# Patient Record
Sex: Female | Born: 1950 | Race: White | Hispanic: No | Marital: Married | Smoking: Never smoker
Health system: Southern US, Community
[De-identification: ages and names within clinical notes are randomized; demographics above are authoritative.]

## PROBLEM LIST (undated history)

## (undated) DIAGNOSIS — I10 Essential (primary) hypertension: Secondary | ICD-10-CM

## (undated) DIAGNOSIS — J45909 Unspecified asthma, uncomplicated: Secondary | ICD-10-CM

## (undated) DIAGNOSIS — E785 Hyperlipidemia, unspecified: Secondary | ICD-10-CM

## (undated) DIAGNOSIS — M1990 Unspecified osteoarthritis, unspecified site: Secondary | ICD-10-CM

## (undated) DIAGNOSIS — T7840XA Allergy, unspecified, initial encounter: Secondary | ICD-10-CM

## (undated) DIAGNOSIS — G43909 Migraine, unspecified, not intractable, without status migrainosus: Secondary | ICD-10-CM

## (undated) DIAGNOSIS — E039 Hypothyroidism, unspecified: Secondary | ICD-10-CM

## (undated) DIAGNOSIS — K219 Gastro-esophageal reflux disease without esophagitis: Secondary | ICD-10-CM

## (undated) DIAGNOSIS — F32A Depression, unspecified: Secondary | ICD-10-CM

## (undated) DIAGNOSIS — H409 Unspecified glaucoma: Secondary | ICD-10-CM

## (undated) DIAGNOSIS — G459 Transient cerebral ischemic attack, unspecified: Secondary | ICD-10-CM

## (undated) DIAGNOSIS — G4730 Sleep apnea, unspecified: Secondary | ICD-10-CM

## (undated) HISTORY — PX: OOPHORECTOMY: SHX86

## (undated) HISTORY — DX: Transient cerebral ischemic attack, unspecified: G45.9

## (undated) HISTORY — PX: BREAST BIOPSY: SHX20

## (undated) HISTORY — PX: ABDOMINAL HYSTERECTOMY: SHX81

## (undated) HISTORY — DX: Allergy, unspecified, initial encounter: T78.40XA

## (undated) HISTORY — PX: CHOLECYSTECTOMY: SHX55

## (undated) HISTORY — DX: Depression, unspecified: F32.A

## (undated) HISTORY — DX: Hyperlipidemia, unspecified: E78.5

## (undated) HISTORY — DX: Unspecified glaucoma: H40.9

## (undated) HISTORY — DX: Sleep apnea, unspecified: G47.30

## (undated) HISTORY — PX: CATARACT EXTRACTION W/ INTRAOCULAR LENS IMPLANT: SHX1309

---

## 1993-05-25 HISTORY — PX: OOPHORECTOMY: SHX86

## 1993-05-25 HISTORY — PX: APPENDECTOMY: SHX54

## 1993-05-25 HISTORY — PX: ABDOMINAL HYSTERECTOMY: SHX81

## 2003-05-26 HISTORY — PX: CHOLECYSTECTOMY: SHX55

## 2019-06-22 ENCOUNTER — Emergency Department: Payer: Medicare Other

## 2019-06-22 ENCOUNTER — Other Ambulatory Visit: Payer: Self-pay

## 2019-06-22 DIAGNOSIS — I10 Essential (primary) hypertension: Secondary | ICD-10-CM | POA: Insufficient documentation

## 2019-06-22 DIAGNOSIS — R404 Transient alteration of awareness: Secondary | ICD-10-CM | POA: Insufficient documentation

## 2019-06-22 DIAGNOSIS — R42 Dizziness and giddiness: Secondary | ICD-10-CM | POA: Diagnosis not present

## 2019-06-22 DIAGNOSIS — J019 Acute sinusitis, unspecified: Secondary | ICD-10-CM | POA: Insufficient documentation

## 2019-06-22 DIAGNOSIS — G43901 Migraine, unspecified, not intractable, with status migrainosus: Secondary | ICD-10-CM | POA: Diagnosis not present

## 2019-06-22 DIAGNOSIS — R519 Headache, unspecified: Secondary | ICD-10-CM | POA: Diagnosis present

## 2019-06-22 LAB — URINALYSIS, COMPLETE (UACMP) WITH MICROSCOPIC
Bacteria, UA: NONE SEEN
Bilirubin Urine: NEGATIVE
Glucose, UA: NEGATIVE mg/dL
Hgb urine dipstick: NEGATIVE
Ketones, ur: NEGATIVE mg/dL
Leukocytes,Ua: NEGATIVE
Nitrite: NEGATIVE
Protein, ur: NEGATIVE mg/dL
Specific Gravity, Urine: 1.002 — ABNORMAL LOW (ref 1.005–1.030)
Squamous Epithelial / HPF: NONE SEEN (ref 0–5)
pH: 7 (ref 5.0–8.0)

## 2019-06-22 LAB — COMPREHENSIVE METABOLIC PANEL
ALT: 22 U/L (ref 0–44)
AST: 25 U/L (ref 15–41)
Albumin: 4.6 g/dL (ref 3.5–5.0)
Alkaline Phosphatase: 116 U/L (ref 38–126)
Anion gap: 8 (ref 5–15)
BUN: 17 mg/dL (ref 8–23)
CO2: 25 mmol/L (ref 22–32)
Calcium: 9.7 mg/dL (ref 8.9–10.3)
Chloride: 101 mmol/L (ref 98–111)
Creatinine, Ser: 0.78 mg/dL (ref 0.44–1.00)
GFR calc Af Amer: >60 mL/min (ref >60)
GFR calc non Af Amer: >60 mL/min (ref >60)
Glucose, Bld: 98 mg/dL (ref 70–99)
Potassium: 4.1 mmol/L (ref 3.5–5.1)
Sodium: 134 mmol/L — ABNORMAL LOW (ref 135–145)
Total Bilirubin: 0.8 mg/dL (ref 0.3–1.2)
Total Protein: 7.7 g/dL (ref 6.5–8.1)

## 2019-06-22 LAB — CBC
HCT: 44.7 % (ref 36.0–46.0)
Hemoglobin: 14.6 g/dL (ref 12.0–15.0)
MCH: 28.3 pg (ref 26.0–34.0)
MCHC: 32.7 g/dL (ref 30.0–36.0)
MCV: 86.6 fL (ref 80.0–100.0)
Platelets: 262 10*3/uL (ref 150–400)
RBC: 5.16 MIL/uL — ABNORMAL HIGH (ref 3.87–5.11)
RDW: 15.9 % — ABNORMAL HIGH (ref 11.5–15.5)
WBC: 9.6 10*3/uL (ref 4.0–10.5)
nRBC: 0 % (ref 0.0–0.2)

## 2019-06-22 NOTE — ED Triage Notes (Signed)
PT to ED via EMS from home. PT c/o feeling "fuzzy headed". PT a/o. C/O headache with no relief from sumatriptan. States this does not feel like a normal migraine. LKW 1500.

## 2019-06-23 ENCOUNTER — Emergency Department: Payer: Medicare Other

## 2019-06-23 ENCOUNTER — Emergency Department
Admission: EM | Admit: 2019-06-23 | Discharge: 2019-06-23 | Disposition: A | Discharge status: Home / Self Care | Payer: Medicare Other | Attending: Emergency Medicine | Admitting: Emergency Medicine

## 2019-06-23 DIAGNOSIS — R404 Transient alteration of awareness: Secondary | ICD-10-CM

## 2019-06-23 DIAGNOSIS — I10 Essential (primary) hypertension: Secondary | ICD-10-CM

## 2019-06-23 DIAGNOSIS — G43901 Migraine, unspecified, not intractable, with status migrainosus: Secondary | ICD-10-CM

## 2019-06-23 DIAGNOSIS — J019 Acute sinusitis, unspecified: Secondary | ICD-10-CM

## 2019-06-23 HISTORY — DX: Gastro-esophageal reflux disease without esophagitis: K21.9

## 2019-06-23 HISTORY — DX: Unspecified asthma, uncomplicated: J45.909

## 2019-06-23 HISTORY — DX: Unspecified osteoarthritis, unspecified site: M19.90

## 2019-06-23 HISTORY — DX: Essential (primary) hypertension: I10

## 2019-06-23 HISTORY — DX: Migraine, unspecified, not intractable, without status migrainosus: G43.909

## 2019-06-23 HISTORY — DX: Hypothyroidism, unspecified: E03.9

## 2019-06-23 MED ORDER — DEXAMETHASONE SODIUM PHOSPHATE 10 MG/ML IJ SOLN
10.0000 mg | Freq: Once | INTRAMUSCULAR | Status: AC
  Administered 2019-06-23: 10 mg via INTRAVENOUS
  Filled 2019-06-23: qty 1

## 2019-06-23 MED ORDER — KETOROLAC TROMETHAMINE 30 MG/ML IJ SOLN
15.0000 mg | Freq: Once | INTRAMUSCULAR | Status: AC
  Administered 2019-06-23: 15 mg via INTRAVENOUS
  Filled 2019-06-23: qty 1

## 2019-06-23 MED ORDER — DROPERIDOL 2.5 MG/ML IJ SOLN
2.5000 mg | Freq: Once | INTRAMUSCULAR | Status: AC
  Administered 2019-06-23: 2.5 mg via INTRAVENOUS
  Filled 2019-06-23: qty 2

## 2019-06-23 MED ORDER — SODIUM CHLORIDE 0.9 % IV BOLUS
500.0000 mL | Freq: Once | INTRAVENOUS | Status: AC
  Administered 2019-06-23: 500 mL via INTRAVENOUS

## 2019-06-23 NOTE — Discharge Instructions (Signed)
As we discussed, your evaluation today was generally reassuring.  Your lab work was within normal limits and he had a CT scan of your head as well as an MRI of the brain.  Other than what appears to be sinusitis, you had no evidence of acute abnormality such as bleeding in the brain or evidence of a stroke.  Your blood pressure is quite high tonight but is unclear if this is due to the pain and being in the emergency department or if it is representative of baseline uncontrolled hypertension.  We discussed the risks and benefits of starting new medications and both agree that it would be better if you follow-up with your primary care doctor to discuss changes to your existing medication regimen.  However if you develop any new or worsening symptoms that concern you while you are visiting in [place], please return immediately to the emergency department.  Continue to take your regular medication as prescribed.

## 2019-06-23 NOTE — ED Notes (Signed)
Pt states she has a headache that started around 8 pm tonight accompanied by disorientation and confusion.  She states she has migraines sometimes and the pain feels similar to that, but the disorientation is new.  She took Imitrex 50mg  and Tylenol 1000mg  around 830 pm.  The pain has not decreased.

## 2019-06-23 NOTE — ED Provider Notes (Signed)
[facility] Emergency Department Provider Note  ____________________________________________   First MD Initiated Contact with Patient 06/23/19 [phone]     (approximate)  I have reviewed the triage vital signs and the nursing notes.   HISTORY  Chief Complaint Headache and Dizziness    HPI Joyce York is a 69 y.o. female with medical history as listed below who presents for evaluation of severe headache, uncontrolled hypertension, and confusion.  She states that she developed a headache tonight starting around 8 PM (approximately 5 hours after I am evaluating her).  After she thought it is her normal migraine but it is worse and more generalized than usual.  She describes the pain as throbbing and sharp and pressure-like and is all throughout the front and middle part of her head.  It is not shooting into her eyes as it sometimes does and she has no visual changes.  She said that she went about her activities with her family this evening but she became aware that she was acting unusual.  Her daughter kept looking at her funny because she was making odd comments that did not make sense and she said that she felt "disoriented".  When the symptoms persisted  and the pain has become severe, her daughter recommended that she come to the emergency department for evaluation.  She has no numbness nor weakness in any of her extremities.  She has no difficulty formulating words or finding the words she wants to say.  She is having some short-term memory issues such as not being able to remember what games she was playing with her grandchildren just a couple of hours ago prior to coming to the emergency department.  She denies fever/chills, neck pain, neck stiffness, chest pain, shortness of breath, cough, nausea, vomiting, abdominal pain, and dysuria.  Nothing in particular makes the symptoms better or worse including taking Imitrex and Tylenol prior to arrival.  Although she  does have a history of migraines, she has no history of stroke.  She takes verapamil for her hypertension as a single agent because she said that she had issues with an ACE inhibitor and thinks that she did not do well on a beta-blocker previously.        Past Medical History:  Diagnosis Date  . Arthritis   . Asthma   . GERD (gastroesophageal reflux disease)   . Hypertension   . Hypothyroidism   . Migraine     There are no problems to display for this patient.   Past Surgical History:  Procedure Laterality Date  . ABDOMINAL HYSTERECTOMY    . CHOLECYSTECTOMY    . OOPHORECTOMY      Prior to Admission medications   Not on File    Allergies Patient has no known allergies.  No family history on file.  Social History Social History   Tobacco Use  . Smoking status: Never Smoker  . Smokeless tobacco: Never Used  Substance Use Topics  . Alcohol use: Yes    Comment: rarely  . Drug use: Yes    Types: Marijuana    Comment: "edible every once in a while"     Review of Systems Constitutional: No fever/chills Eyes: No visual changes. ENT: No sore throat. Cardiovascular: Denies chest pain. Respiratory: Denies shortness of breath. Gastrointestinal: No abdominal pain.  No nausea, no vomiting.  No diarrhea.  No constipation. Genitourinary: Negative for dysuria. Musculoskeletal: Negative for neck pain.  Negative for back pain. Integumentary: Negative for rash. Neurological:  Confusion/disorientation.  Severe pressure-like headache, no visual changes.  No focal numbness nor weakness.   ____________________________________________   PHYSICAL EXAM:  VITAL SIGNS: ED Triage Vitals  Enc Vitals Group     BP 06/22/19 2122 (!) 200/96     Pulse Rate 06/22/19 2122 86     Resp 06/22/19 2122 18     Temp 06/22/19 2122 98.6 F (37 C)     Temp Source 06/23/19 0029 Oral     SpO2 06/22/19 2122 99 %     Weight 06/22/19 2123 81.6 kg (180 lb)     Height 06/22/19 2123 1.549 m (5'  1")     Head Circumference --      Peak Flow --      Pain Score 06/22/19 2123 7     Pain Loc --      Pain Edu? --      Excl. in GC? --     Constitutional: Alert and oriented.  Eyes: Conjunctivae are normal.  Head: Atraumatic. Nose: No congestion/rhinnorhea. Mouth/Throat: Patient is wearing a mask. Neck: No stridor.  No meningeal signs.   Cardiovascular: Normal rate, regular rhythm. Good peripheral circulation. Grossly normal heart sounds. Respiratory: Normal respiratory effort.  No retractions. Gastrointestinal: Soft and nontender. No distention.  Musculoskeletal: No lower extremity tenderness nor edema. No gross deformities of extremities. Neurologic:  Normal speech and language. No gross focal neurologic deficits are appreciated.  NIH stroke scale is 0. Skin:  Skin is warm, dry and intact. Psychiatric: Mood and affect are normal. Speech and behavior are normal.   ____________________________________________   LABS (all labs ordered are listed, but only abnormal results are displayed)  Labs Reviewed  COMPREHENSIVE METABOLIC PANEL - Abnormal; Notable for the following components:      Result Value   Sodium 134 (*)    All other components within normal limits  CBC - Abnormal; Notable for the following components:   RBC 5.16 (*)    RDW 15.9 (*)    All other components within normal limits  URINALYSIS, COMPLETE (UACMP) WITH MICROSCOPIC - Abnormal; Notable for the following components:   Color, Urine COLORLESS (*)    APPearance CLEAR (*)    Specific Gravity, Urine 1.002 (*)    All other components within normal limits  CBG MONITORING, ED   ____________________________________________  EKG  ED ECG REPORT I, Loleta Rose, the attending physician, personally viewed and interpreted this ECG.  Date: 06/22/2019 EKG Time: 21: 28 Rate: 81 Rhythm: normal sinus rhythm QRS Axis: normal Intervals: normal ST/T Wave abnormalities: Non-specific ST segment / T-wave changes, but  no clear evidence of acute ischemia. Narrative Interpretation: no definitive evidence of acute ischemia; does not meet STEMI criteria.   ____________________________________________  RADIOLOGY I, Loleta Rose, personally viewed and evaluated these images (plain radiographs) as part of my medical decision making, as well as reviewing the written report by the radiologist.  ED MD interpretation: No acute abnormalities on head CT without contrast. MRI demonstrates no acute emergent findings although the patient does have some sinusitis.  Official radiology report(s): CT Head Wo Contrast  Result Date: 06/22/2019 CLINICAL DATA:  Headache EXAM: CT HEAD WITHOUT CONTRAST TECHNIQUE: Contiguous axial images were obtained from the base of the skull through the vertex without intravenous contrast. COMPARISON:  None. FINDINGS: Brain: No evidence of acute infarction, hemorrhage, hydrocephalus, extra-axial collection or mass lesion/mass effect. Vascular: No hyperdense vessel or unexpected calcification. Skull: Normal. Negative for fracture or focal lesion. Sinuses/Orbits: There is bilateral ethmoid sinus  disease. Other: None. IMPRESSION: 1. No acute intracranial process. Electronically Signed   By: Zerita Boers M.D.   On: 06/22/2019 22:06   MR BRAIN WO CONTRAST  Result Date: 06/23/2019 CLINICAL DATA:  Initial evaluation for acute headache.  Delirium. EXAM: MRI HEAD WITHOUT CONTRAST TECHNIQUE: Multiplanar, multiecho pulse sequences of the brain and surrounding structures were obtained without intravenous contrast. COMPARISON:  Prior head CT from 06/22/2019. FINDINGS: Brain: Cerebral volume within normal limits for age. Scattered subcentimeter foci of T2/FLAIR hyperintensities seen involving the periventricular, deep, and subcortical white matter of both cerebral hemispheres, nonspecific, but most like related chronic microvascular ischemic disease, mild in nature. No abnormal foci of restricted diffusion to  suggest acute or subacute ischemia. Gray-white matter differentiation maintained. No encephalomalacia to suggest chronic cortical infarction. No foci of susceptibility artifact to suggest acute or chronic intracranial hemorrhage. No mass lesion, midline shift or mass effect. No hydrocephalus. No extra-axial fluid collection. Pituitary gland suprasellar region normal. Midline structures intact. Vascular: Major intracranial vascular flow voids are maintained. Skull and upper cervical spine: Craniocervical junction normal. Upper cervical spine within normal limits. Bone marrow signal intensity normal. No scalp soft tissue abnormality. Sinuses/Orbits: Globes and orbital soft tissues within normal limits. Scattered mucosal thickening seen throughout the frontal sinuses, ethmoidal air cells, and maxillary sinuses. Superimposed air-fluid level present within the left maxillary sinus, consistent with acute sinusitis. Small retention cyst noted within the right sphenoid sinus. Mastoid air cells are clear. Inner ear structures grossly normal. Other: None. IMPRESSION: 1. No acute intracranial abnormality. 2. Mild cerebral white matter disease, nonspecific, but most like related chronic microvascular ischemic changes. 3. Acute left maxillary and ethmoidal sinusitis, which could be contributory to underlying headache. Electronically Signed   By: Jeannine Boga M.D.   On: 06/23/2019 02:46    ____________________________________________   PROCEDURES   Procedure(s) performed (including Critical Care):  Procedures   ____________________________________________   INITIAL IMPRESSION / MDM / [place] / ED COURSE  As part of my medical decision making, I reviewed the following data within the [facility] notes reviewed and incorporated, Labs reviewed , Old chart reviewed and Notes from prior ED visits   Differential diagnosis includes, but is not limited to, complicated  migraine, uncontrolled/malignant hypertension, acute intracranial bleeding, CVA, PRES syndrome.  The patient is significantly hypertensive and apparently this is abnormal for her.  My concern is that this may be hypertension in response to an acute intracranial event such as a CVA or TIA and I am reluctant to acutely drop her blood pressure with IV agents.  Alternatively she could be having hypertension because of the pain secondary to a complicated and atypical migraine.  She has no signs or symptoms of infection that her CBC and metabolic panel are within normal limits.  She has a reassuring neurological exam with an NIH stroke scale is 0, but she is obviously concerned about some memory issues and the confusion she was exhibiting at home earlier.  I will evaluate with an MR brain without contrast to look for any evidence of CVA and/or PRES syndrome.  Permissive hypertension for now, but I will treat her headache with droperidol 2.5 mg IV, Toradol 15 mg IV, Decadron 10 mg IV, and a 500 mL normal saline IV bolus.  I will reassess after the imaging and after the medical treatment to see if she is feeling better.  She understands and agrees with plan.      Clinical Course as of  Jun 22 348  Fri Jun 23, 2019  8280 MRI is reassuring with no acute findings, some chronic changes.  The patient feels a lot better after the migraine treatment.  We discussed her persistent hypertension and the risks and benefits of treating with additional medication at this time.  I offered to prescribe medication such as metoprolol but explained that I would be a little bit more comfortable if she were to follow-up with her doctor.  She agrees and would rather not try any new medications at this time.  I told her about the sinusitis on the MRI and she said that she does have bad sinuses.  She agrees with no antibiotics at this time and will follow up with her doctor soon as possible.  I encouraged her to continue taking her  antihypertensives and to return immediately to the emergency department with any new or worsening symptoms.  She has exhibited no signs of altered mental status while being here, she is ambulatory without difficulty, and she wants to be discharged.  She understands that she should come back if her symptoms worsen.   [CF]    Clinical Course User Index [CF] Loleta Rose, MD     ____________________________________________  FINAL CLINICAL IMPRESSION(S) / ED DIAGNOSES  Final diagnoses:  Migraine with status migrainosus, not intractable, unspecified migraine type  Transient alteration of awareness  Uncontrolled hypertension  Acute sinusitis, recurrence not specified, unspecified location     MEDICATIONS GIVEN DURING THIS VISIT:  Medications  droperidol (INAPSINE) 2.5 MG/ML injection 2.5 mg (2.5 mg Intravenous Given 06/23/19 0221)  ketorolac (TORADOL) 30 MG/ML injection 15 mg (15 mg Intravenous Given 06/23/19 0226)  dexamethasone (DECADRON) injection 10 mg (10 mg Intravenous Given 06/23/19 0225)  sodium chloride 0.9 % bolus 500 mL (500 mLs Intravenous New Bag/Given 06/23/19 0220)     ED Discharge Orders    None      *Please note:  Joyce York was evaluated in Emergency Department on 06/23/2019 for the symptoms described in the history of present illness. She was evaluated in the context of the global COVID-19 pandemic, which necessitated consideration that the patient might be at risk for infection with the SARS-CoV-2 virus that causes COVID-19. Institutional protocols and algorithms that pertain to the evaluation of patients at risk for COVID-19 are in a state of rapid change based on information released by regulatory bodies including the CDC and federal and state organizations. These policies and algorithms were followed during the patient's care in the ED.  Some ED evaluations and interventions may be delayed as a result of limited staffing during the pandemic.*  Note:  This  document was prepared using Dragon voice recognition software and may include unintentional dictation errors.   Loleta Rose, MD 06/23/19 [phone]

## 2021-01-13 IMAGING — MR MR HEAD W/O CM
12 series · 48 of 48 positions shown · non-contrast
Comparison: Prior head CT from 06/22/2019.

CLINICAL DATA: Initial evaluation for acute headache.  Delirium.

EXAM:
MRI HEAD WITHOUT CONTRAST
TECHNIQUE: Multiplanar, multiecho pulse sequences of the brain and surrounding
structures were obtained without intravenous contrast.

[Series 5: ax dwi_tracew · axial · 3.0mm · 0.60mm/px · z∈[-113,+41]mm · 2 of 48 slices shown]
[im 1/48]
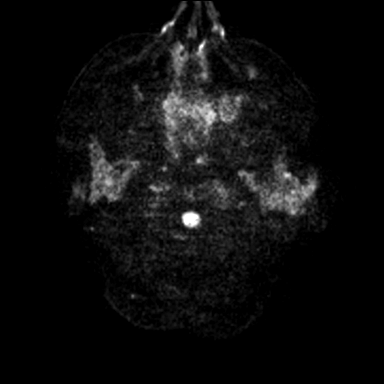
[im 48/48]
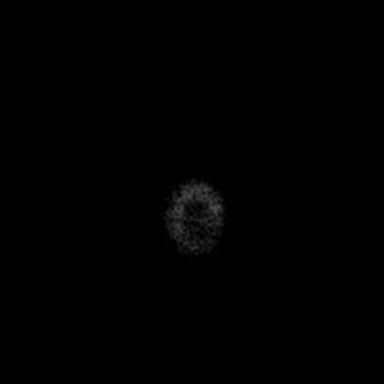

[Series 6: ax dwi_adc · axial · 3.0mm · 0.60mm/px · z∈[-113,+41]mm · 3 of 48 slices shown]
[im 1/48]
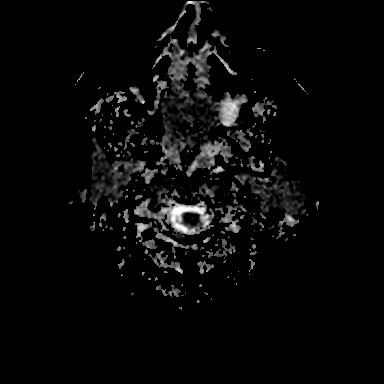
[im 24/48]
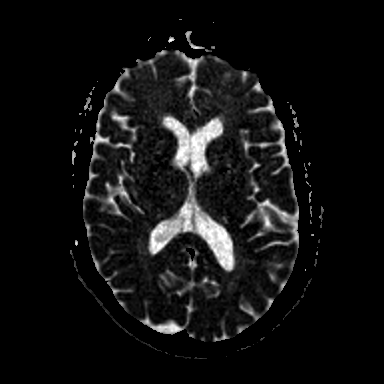
[im 48/48]
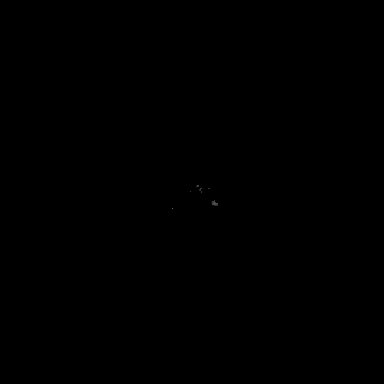

[Series 7: cor dwi_tracew · coronal · 5.0mm · 1.31mm/px · 5 of 76 slices shown]
[im 1/76]
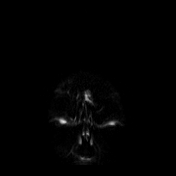
[im 19/76]
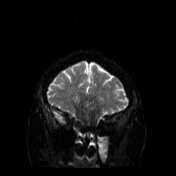
[im 38/76]
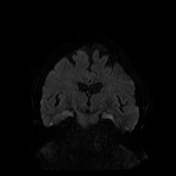
[im 57/76]
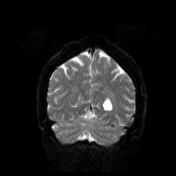
[im 76/76]
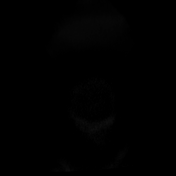

[Series 9: T1 · sagittal · 5.0mm · 0.94mm/px · 2 of 23 slices shown (1 of 2)]
[im 1/23]
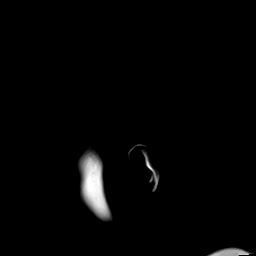
[im 23/23]
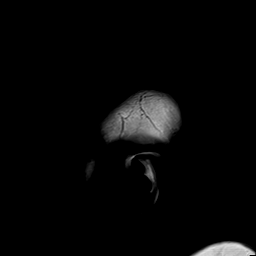

[Series 10: T2 · axial · 5.0mm · 0.53mm/px · z∈[-107,+36]mm · 2 of 25 slices shown (1 of 2)]
[im 1/25]
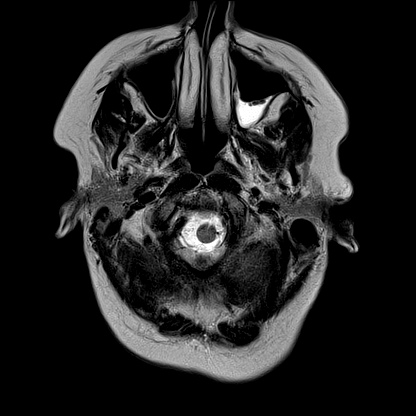
[im 25/25]
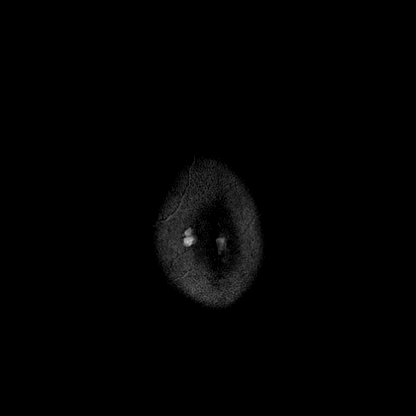

[Series 11: mag_images · axial · 3.0mm · 0.90mm/px · z∈[-124,+52]mm · 4 of 60 slices shown]
[im 1/60]
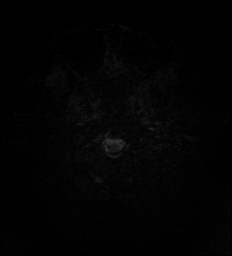
[im 20/60]
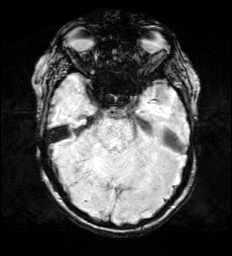
[im 40/60]
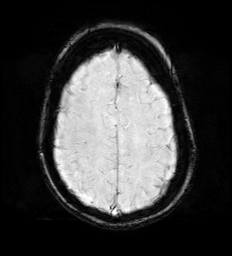
[im 60/60]
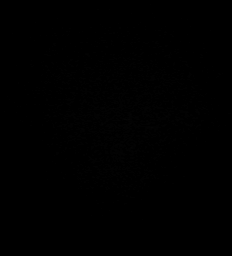

[Series 12: pha_images · axial · 3.0mm · 0.90mm/px · z∈[-124,+46]mm · 4 of 58 slices shown]
[im 1/58]
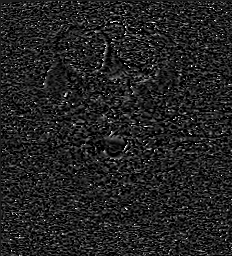
[im 20/58]
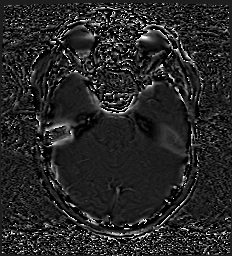
[im 39/58]
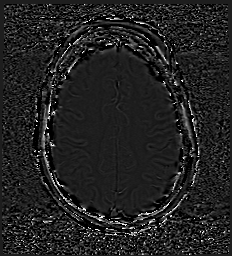
[im 58/58]
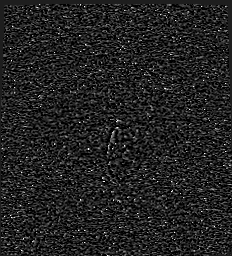

[Series 13: swi_images · axial · 3.0mm · 0.90mm/px · z∈[-124,+52]mm · 4 of 60 slices shown]
[im 1/60]
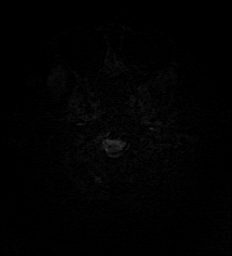
[im 20/60]
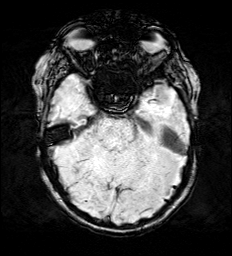
[im 40/60]
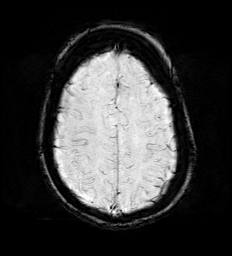
[im 60/60]
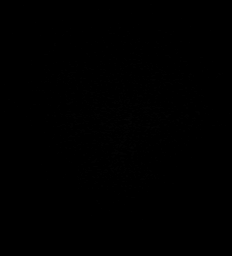

[Series 14: mip_images(sw) · axial · 24.0mm · 0.90mm/px · z∈[-113,+41]mm · 4 of 53 slices shown]
[im 1/53]
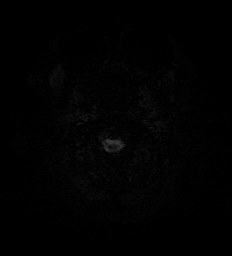
[im 18/53]
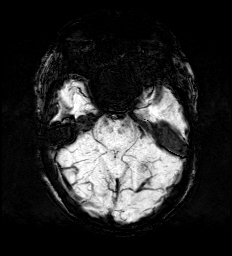
[im 35/53]
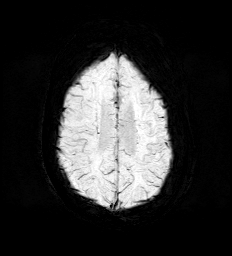
[im 53/53]
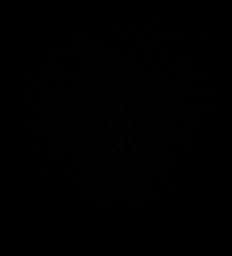

[Series 15: FLAIR · axial · 3.0mm · 0.53mm/px · z∈[-116,+45]mm · 4 of 55 slices shown]
[im 1/55]
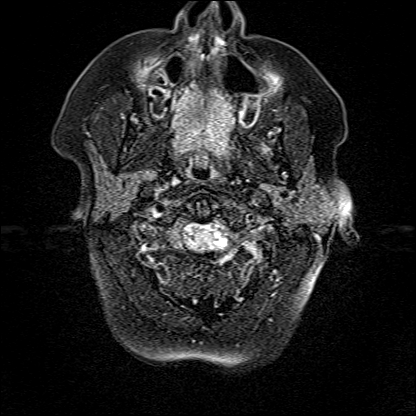
[im 19/55]
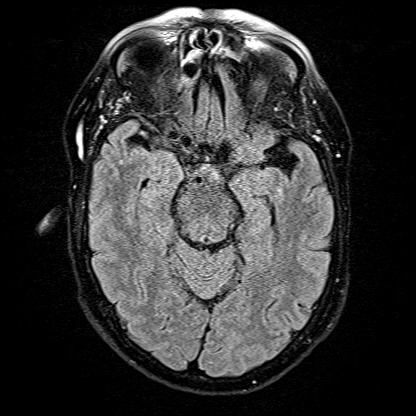
[im 37/55]
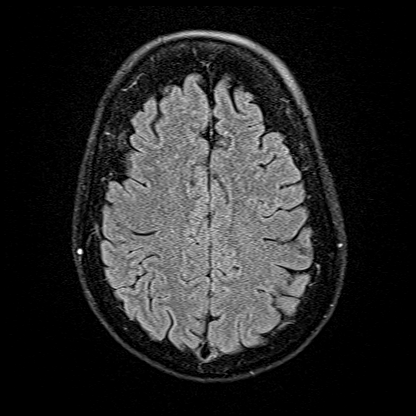
[im 55/55]
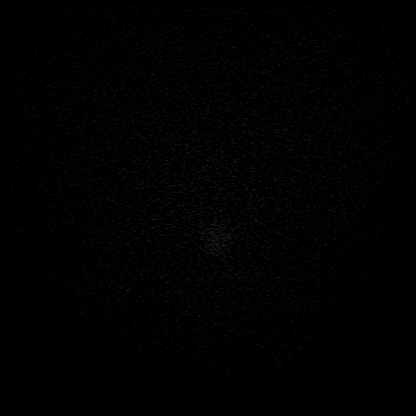

[Series 16: T1 · axial · 1.0mm · 0.98mm/px · z∈[-124,+50]mm · 12 of 176 slices shown (2 of 2)]
[im 1/176]
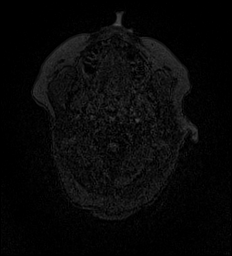
[im 16/176]
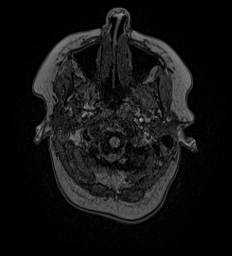
[im 32/176]
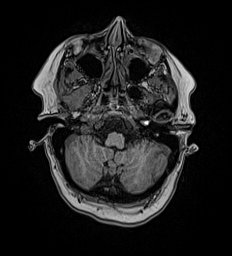
[im 48/176]
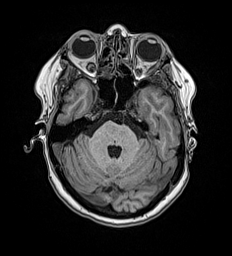
[im 64/176]
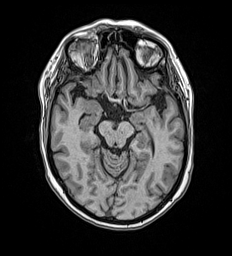
[im 80/176]
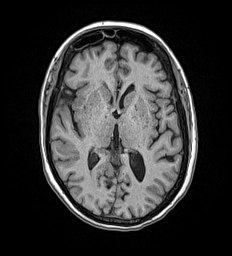
[im 96/176]
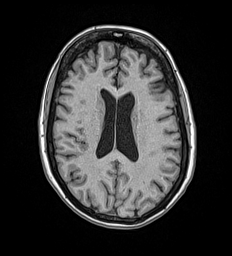
[im 112/176]
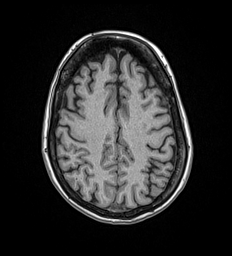
[im 128/176]
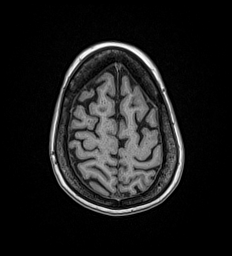
[im 144/176]
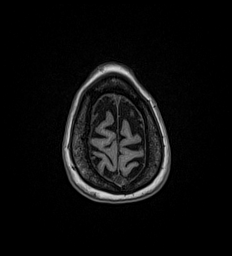
[im 160/176]
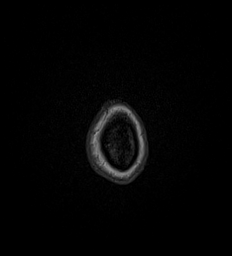
[im 176/176]
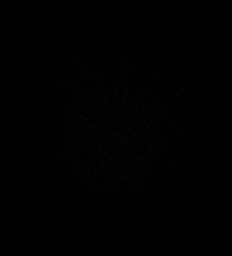

[Series 17: T2 · coronal · 5.0mm · 0.45mm/px · 2 of 31 slices shown (2 of 2)]
[im 1/31]
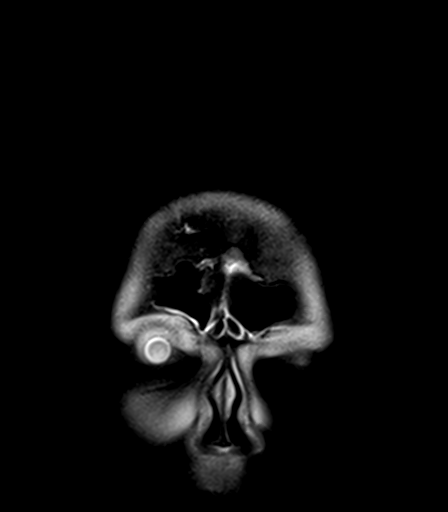
[im 31/31]
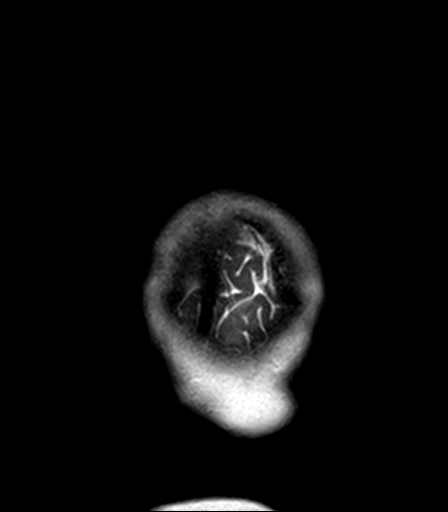

[48 of 48 positions shown; findings below may reference images not displayed]

FINDINGS: Brain: Cerebral volume within normal limits for age. Scattered
subcentimeter foci of T2/FLAIR hyperintensities seen involving the
periventricular, deep, and subcortical white matter of both cerebral
hemispheres, nonspecific, but most like related chronic
microvascular ischemic disease, mild in nature.

No abnormal foci of restricted diffusion to suggest acute or
subacute ischemia. Gray-white matter differentiation maintained. No
encephalomalacia to suggest chronic cortical infarction. No foci of
susceptibility artifact to suggest acute or chronic intracranial
hemorrhage.

No mass lesion, midline shift or mass effect. No hydrocephalus. No
extra-axial fluid collection. Pituitary gland suprasellar region
normal. Midline structures intact.

Vascular: Major intracranial vascular flow voids are maintained.

Skull and upper cervical spine: Craniocervical junction normal.
Upper cervical spine within normal limits. Bone marrow signal
intensity normal. No scalp soft tissue abnormality.

Sinuses/Orbits: Globes and orbital soft tissues within normal
limits. Scattered mucosal thickening seen throughout the frontal
sinuses, ethmoidal air cells, and maxillary sinuses. Superimposed
air-fluid level present within the left maxillary sinus, consistent
with acute sinusitis. Small retention cyst noted within the right
sphenoid sinus. Mastoid air cells are clear. Inner ear structures
grossly normal.

Other: None.
IMPRESSION: 1. No acute intracranial abnormality.
2. Mild cerebral white matter disease, nonspecific, but most like
related chronic microvascular ischemic changes.
3. Acute left maxillary and ethmoidal sinusitis, which could be
contributory to underlying headache.

## 2021-10-23 HISTORY — PX: REVERSE SHOULDER ARTHROPLASTY: SHX5054

## 2023-01-14 LAB — HM HEPATITIS C SCREENING LAB: HM Hepatitis Screen: NEGATIVE

## 2023-06-30 DIAGNOSIS — H40003 Preglaucoma, unspecified, bilateral: Secondary | ICD-10-CM | POA: Diagnosis not present

## 2023-07-25 DIAGNOSIS — E782 Mixed hyperlipidemia: Secondary | ICD-10-CM | POA: Insufficient documentation

## 2023-07-25 DIAGNOSIS — J4520 Mild intermittent asthma, uncomplicated: Secondary | ICD-10-CM | POA: Insufficient documentation

## 2023-07-25 DIAGNOSIS — I10 Essential (primary) hypertension: Secondary | ICD-10-CM | POA: Insufficient documentation

## 2023-07-25 DIAGNOSIS — E038 Other specified hypothyroidism: Secondary | ICD-10-CM | POA: Insufficient documentation

## 2023-07-25 DIAGNOSIS — F325 Major depressive disorder, single episode, in full remission: Secondary | ICD-10-CM | POA: Insufficient documentation

## 2023-07-25 DIAGNOSIS — H3530 Unspecified macular degeneration: Secondary | ICD-10-CM | POA: Insufficient documentation

## 2023-07-25 HISTORY — DX: Unspecified macular degeneration: H35.30

## 2023-07-25 NOTE — Progress Notes (Unsigned)
   Lil Lepage T. Tobias Avitabile, MD, CAQ Sports Medicine [facility] [address] [place], [address]  Phone: [phone]  FAX: [phone]  Joyce York - 73 y.o. female  MRN [id number]  Date of Birth: 1950-09-01  Date: 07/26/2023  PCP: Pcp, No  Referral: No ref. provider found  No chief complaint on file.  Subjective:   Joyce York is a 73 y.o. very pleasant female patient with There is no height or weight on file to calculate BMI. who presents with the following:  Patient is a 73 year old patient who is new to me and new to the entirety of the [facility] health system.    On chart review with what is available and care everywhere, the patient does have a history of hypertension, asthma, hyperlipidemia, hypothyroidism as well as macular degeneration.  She did have a prior full-thickness rotator cuff tear on the left side and ultimately received left-sided reverse shoulder arthroplasty in [place].    Review of Systems is noted in the HPI, as appropriate  Objective:   There were no vitals taken for this visit.  GEN: No acute distress; alert,appropriate. PULM: Breathing comfortably in no respiratory distress PSYCH: Normally interactive.   Laboratory and Imaging Data:  Assessment and Plan:   ***

## 2023-07-26 ENCOUNTER — Encounter: Payer: Self-pay | Admitting: Family Medicine

## 2023-07-26 ENCOUNTER — Other Ambulatory Visit: Payer: Self-pay | Admitting: Family Medicine

## 2023-07-26 ENCOUNTER — Ambulatory Visit (INDEPENDENT_AMBULATORY_CARE_PROVIDER_SITE_OTHER): Payer: Medicare Other | Admitting: Family Medicine

## 2023-07-26 VITALS — BP 132/74 | HR 81 | Temp 97.8°F | Ht 60.0 in | Wt 203.0 lb

## 2023-07-26 DIAGNOSIS — J4520 Mild intermittent asthma, uncomplicated: Secondary | ICD-10-CM | POA: Diagnosis not present

## 2023-07-26 DIAGNOSIS — E038 Other specified hypothyroidism: Secondary | ICD-10-CM | POA: Diagnosis not present

## 2023-07-26 DIAGNOSIS — F325 Major depressive disorder, single episode, in full remission: Secondary | ICD-10-CM | POA: Diagnosis not present

## 2023-07-26 DIAGNOSIS — G2581 Restless legs syndrome: Secondary | ICD-10-CM | POA: Diagnosis not present

## 2023-07-26 DIAGNOSIS — H3530 Unspecified macular degeneration: Secondary | ICD-10-CM

## 2023-07-26 DIAGNOSIS — Z6841 Body Mass Index (BMI) 40.0 and over, adult: Secondary | ICD-10-CM

## 2023-07-26 DIAGNOSIS — F411 Generalized anxiety disorder: Secondary | ICD-10-CM | POA: Insufficient documentation

## 2023-07-26 DIAGNOSIS — G4733 Obstructive sleep apnea (adult) (pediatric): Secondary | ICD-10-CM | POA: Diagnosis not present

## 2023-07-26 DIAGNOSIS — K2270 Barrett's esophagus without dysplasia: Secondary | ICD-10-CM | POA: Insufficient documentation

## 2023-07-26 DIAGNOSIS — F32A Depression, unspecified: Secondary | ICD-10-CM | POA: Insufficient documentation

## 2023-07-26 DIAGNOSIS — G459 Transient cerebral ischemic attack, unspecified: Secondary | ICD-10-CM | POA: Insufficient documentation

## 2023-07-26 DIAGNOSIS — G43909 Migraine, unspecified, not intractable, without status migrainosus: Secondary | ICD-10-CM | POA: Insufficient documentation

## 2023-07-26 DIAGNOSIS — M1990 Unspecified osteoarthritis, unspecified site: Secondary | ICD-10-CM | POA: Insufficient documentation

## 2023-07-26 DIAGNOSIS — H409 Unspecified glaucoma: Secondary | ICD-10-CM | POA: Insufficient documentation

## 2023-07-26 DIAGNOSIS — I10 Essential (primary) hypertension: Secondary | ICD-10-CM

## 2023-07-26 DIAGNOSIS — E782 Mixed hyperlipidemia: Secondary | ICD-10-CM | POA: Diagnosis not present

## 2023-07-26 DIAGNOSIS — K219 Gastro-esophageal reflux disease without esophagitis: Secondary | ICD-10-CM | POA: Insufficient documentation

## 2023-07-26 DIAGNOSIS — Z8673 Personal history of transient ischemic attack (TIA), and cerebral infarction without residual deficits: Secondary | ICD-10-CM

## 2023-07-26 DIAGNOSIS — J45909 Unspecified asthma, uncomplicated: Secondary | ICD-10-CM | POA: Insufficient documentation

## 2023-07-26 HISTORY — DX: Restless legs syndrome: G25.81

## 2023-07-26 HISTORY — DX: Obstructive sleep apnea (adult) (pediatric): G47.33

## 2023-07-26 HISTORY — DX: Barrett's esophagus without dysplasia: K22.70

## 2023-07-26 HISTORY — DX: Generalized anxiety disorder: F41.1

## 2023-07-26 MED ORDER — WEGOVY 0.25 MG/0.5ML ~~LOC~~ SOAJ: 0.2500 mg | SUBCUTANEOUS | 0 refills | Status: DC

## 2023-07-26 MED ORDER — NURTEC 75 MG PO TBDP: 1.0000 | ORAL_TABLET | ORAL | 3 refills | Status: DC

## 2023-07-26 MED ORDER — WEGOVY 0.5 MG/0.5ML ~~LOC~~ SOAJ: 0.5000 mg | SUBCUTANEOUS | 3 refills | Status: DC

## 2023-07-26 MED ORDER — LEVOTHYROXINE SODIUM 100 MCG PO TABS: 100.0000 ug | ORAL_TABLET | Freq: Every day | ORAL | 3 refills | Status: AC

## 2023-07-26 MED ORDER — VERAPAMIL HCL ER 240 MG PO CP24: 240.0000 mg | ORAL_CAPSULE | Freq: Every morning | ORAL | 3 refills | Status: AC

## 2023-08-03 ENCOUNTER — Other Ambulatory Visit (HOSPITAL_COMMUNITY): Payer: Self-pay

## 2023-08-03 ENCOUNTER — Telehealth: Payer: Self-pay

## 2023-08-03 NOTE — Telephone Encounter (Signed)
 Pharmacy Patient Advocate Encounter   Received notification from CoverMyMeds that prior authorization for (NURTEC) 75 MG TBDP  is required/requested.   Insurance verification completed.   The patient is insured through [facility] .   Per test claim: PA required; PA started via CoverMyMeds. KEY [id number] . SENT TO PLAN

## 2023-08-06 ENCOUNTER — Encounter: Payer: Self-pay | Admitting: *Deleted

## 2023-08-06 NOTE — Telephone Encounter (Signed)
 Joyce York notified as instructed by telephone.  She states [employer] did send her the Nurtec, she just had to pay for it.  She will think about what she wants to do and let us know about trying a different prophylaxis medication.  Also while on the phone patient states she got notification she is due for a mammogram.  She states she does a diagnostic mammogram due to having previous breast biopsies in the past.  Patient prefers [place] location.  Will forward to Dr. Patsy Lager to order diagnostic mammogram.

## 2023-08-06 NOTE — Telephone Encounter (Signed)
 Pharmacy Patient Advocate Encounter  Received notification from [facility] that Prior Authorization for Nurtec Tab 75mg  Odt has been DENIED due to (1) The drug will not be used in combination with another calcitonin gene related peptide inhibitor for the acute treatment of migraines.    Denial letter received via Fax or CMM. It has been requested and will be uploaded to the media tab once received.   PA #/Case ID/Reference #:  [id number] Key: [id number]

## 2023-08-06 NOTE — Telephone Encounter (Signed)
 Noted.

## 2023-08-06 NOTE — Telephone Encounter (Signed)
 Can you call the patient and let her know that her Nurtec has been denied by her insurance.  We may have to transition her to some of the older, generic medications for migraine prophylaxis.

## 2023-08-16 ENCOUNTER — Telehealth: Payer: Self-pay

## 2023-08-16 ENCOUNTER — Encounter: Payer: Self-pay | Admitting: *Deleted

## 2023-08-16 NOTE — Telephone Encounter (Signed)
 Copied from CRM [id number]. Topic: Clinical - Request for Lab/Test Order >> Aug 16, 2023 11:50 AM Mackie Pai E wrote: Reason for CRM: Patient called in requesting to have a mammogram ordered. Patient stated that she did discuss this with Dr. Cyndie Chime nurse. Patient prefers a [facility], but also open to [place] if that is what PCP recommends. Callback number for patient is [phone] to confirm.

## 2023-08-16 NOTE — Telephone Encounter (Signed)
 She can make her own mammogram appointment.  Here are the numbers for the various imaging centers.  You do not need a referral to make a mammogram appointment, and you may call to make her own mammogram appointment directly around your schedule.  [facility] [phone] [address] [place], [place] [address]  [facility] ([facility]) [address] [place], [place] [address] Phone: [phone] Toll Free: [phone]  MAMMOGRAPHY IN [place]:  [facility] or [place]) [phone] Located on the campus of [facility])  [facility] Location) [address]  [place], [place] [address]

## 2023-08-17 ENCOUNTER — Other Ambulatory Visit: Payer: Self-pay | Admitting: Family Medicine

## 2023-08-17 DIAGNOSIS — Z1231 Encounter for screening mammogram for malignant neoplasm of breast: Secondary | ICD-10-CM

## 2023-08-17 NOTE — Telephone Encounter (Signed)
 Joyce York notified as instructed by telephone.  She wants to go to [facility]. Phone number provided and patient will call to schedule her mammogram.

## 2023-08-27 ENCOUNTER — Other Ambulatory Visit: Payer: Self-pay | Admitting: Family Medicine

## 2023-08-27 DIAGNOSIS — I10 Essential (primary) hypertension: Secondary | ICD-10-CM

## 2023-08-27 MED ORDER — LOSARTAN POTASSIUM 100 MG PO TABS: 100.0000 mg | ORAL_TABLET | Freq: Every evening | ORAL | 3 refills | Status: DC

## 2023-08-27 NOTE — Telephone Encounter (Signed)
 Copied from CRM [id number]. Topic: Clinical - Medication Refill >> Aug 27, 2023 10:56 AM [place] A wrote: Most Recent Primary Care Visit:  Provider: Hannah Beat  Department: [facility]  Visit Type: NEW PT - OFFICE VISIT  Date: 07/26/2023  Medication: losartan (COZAAR) 100 MG tablet  Has the patient contacted their pharmacy? No (Agent: If no, request that the patient contact the pharmacy for the refill. If patient does not wish to contact the pharmacy document the reason why and proceed with request.) (Agent: If yes, when and what did the pharmacy advise?)  Is this the correct pharmacy for this prescription? Yes If no, delete pharmacy and type the correct one.  This is the patient's preferred pharmacy:  [facility] - [place], [place] - [address] [place] [address] Phone: [phone] Fax: [phone]  [facility] [address] [place] - [address] [place] [address] Phone: [phone] Fax: [phone]   Has the prescription been filled recently? No  Is the patient out of the medication? No  Has the patient been seen for an appointment in the last year OR does the patient have an upcoming appointment? Yes  Can we respond through MyChart? Yes  Agent: Please be advised that Rx refills may take up to 3 business days. We ask that you follow-up with your pharmacy.

## 2023-09-01 ENCOUNTER — Ambulatory Visit
Admission: RE | Admit: 2023-09-01 | Discharge: 2023-09-01 | Disposition: A | Discharge status: Home / Self Care | Source: Ambulatory Visit | Attending: Family Medicine | Admitting: Family Medicine

## 2023-09-01 DIAGNOSIS — Z1231 Encounter for screening mammogram for malignant neoplasm of breast: Secondary | ICD-10-CM | POA: Insufficient documentation

## 2023-09-02 ENCOUNTER — Inpatient Hospital Stay
Admission: RE | Admit: 2023-09-02 | Discharge: 2023-09-02 | Disposition: A | Discharge status: Home / Self Care | Payer: Self-pay | Source: Ambulatory Visit | Attending: Family Medicine | Admitting: Family Medicine

## 2023-09-02 ENCOUNTER — Other Ambulatory Visit: Payer: Self-pay | Admitting: *Deleted

## 2023-09-02 DIAGNOSIS — Z1231 Encounter for screening mammogram for malignant neoplasm of breast: Secondary | ICD-10-CM

## 2023-09-09 ENCOUNTER — Other Ambulatory Visit: Payer: Self-pay | Admitting: Family Medicine

## 2023-09-09 NOTE — Telephone Encounter (Signed)
 Copied from CRM [id number]. Topic: Clinical - Medication Refill >> Sep 09, 2023  9:07 AM Howard Macho wrote: Most Recent Primary Care Visit:  Provider: Scherrie Curt  Department: [facility] Diss  Visit Type: NEW PT - OFFICE VISIT  Date: 07/26/2023  Medication: cyclobenzaprine (FLEXERIL) 10 MG tablet  Has the patient contacted their pharmacy? No (Agent: If no, request that the patient contact the pharmacy for the refill. If patient does not wish to contact the pharmacy document the reason why and proceed with request.) (Agent: If yes, when and what did the pharmacy advise?)  Is this the correct pharmacy for this prescription? Yes If no, delete pharmacy and type the correct one.  This is the patient's preferred pharmacy:  [phone], [place] - [address] [place] [address] Phone: [phone] Fax: [phone]   Has the prescription been filled recently? Yes  Is the patient out of the medication? Yes  Has the patient been seen for an appointment in the last year OR does the patient have an upcoming appointment? Yes  Can we respond through MyChart? Yes  Agent: Please be advised that Rx refills may take up to 3 business days. We ask that you follow-up with your pharmacy.

## 2023-09-13 MED ORDER — CYCLOBENZAPRINE HCL 10 MG PO TABS: 10.0000 mg | ORAL_TABLET | Freq: Three times a day (TID) | ORAL | 2 refills | Status: DC | PRN

## 2023-09-13 NOTE — Telephone Encounter (Signed)
 Last office visit 07/26/2023 for Establish Care.  Last refilled ? Listed as historical medication.  Next Appt: 01/26/24 for 6 month follow up.

## 2023-09-19 NOTE — Progress Notes (Unsigned)
     Nylah Butkus T. Shaelynn Dragos, MD, CAQ Sports Medicine [facility] at [facility] [address] [place], [address]  Phone: [phone]  FAX: [phone]  Joyce York - 73 y.o. female  MRN [id number]  Date of Birth: 11/28/1950  Date: 09/20/2023  PCP: Scherrie Curt, MD  Referral: Scherrie Curt, MD  No chief complaint on file.  Subjective:   Joyce York is a 73 y.o. very pleasant female patient with There is no height or weight on file to calculate BMI. who presents with the following:  The patient presents in follow-up, I did see her as a new patient appointment in March, she presents with some ongoing shoulder pain.  She does have a history of prior left-sided reverse shoulder arthroplasty.    Review of Systems is noted in the HPI, as appropriate  Objective:   There were no vitals taken for this visit.  GEN: No acute distress; alert,appropriate. PULM: Breathing comfortably in no respiratory distress PSYCH: Normally interactive.   Laboratory and Imaging Data:  Assessment and Plan:   ***

## 2023-09-20 ENCOUNTER — Ambulatory Visit (INDEPENDENT_AMBULATORY_CARE_PROVIDER_SITE_OTHER): Admitting: Family Medicine

## 2023-09-20 VITALS — BP 110/66 | HR 81 | Temp 97.8°F | Ht 60.0 in | Wt 194.4 lb

## 2023-09-20 DIAGNOSIS — M19011 Primary osteoarthritis, right shoulder: Secondary | ICD-10-CM

## 2023-09-20 DIAGNOSIS — Z6841 Body Mass Index (BMI) 40.0 and over, adult: Secondary | ICD-10-CM | POA: Diagnosis not present

## 2023-09-20 MED ORDER — SEMAGLUTIDE (1 MG/DOSE) 4 MG/3ML ~~LOC~~ SOPN: 1.0000 mg | PEN_INJECTOR | SUBCUTANEOUS | 3 refills | Status: DC

## 2023-09-20 MED ORDER — TRIAMCINOLONE ACETONIDE 40 MG/ML IJ SUSP
40.0000 mg | Freq: Once | INTRAMUSCULAR | Status: AC
Start: 2023-09-20 — End: 2023-09-20
  Administered 2023-09-20: 40 mg via INTRA_ARTICULAR

## 2023-09-20 MED ORDER — CARVEDILOL 25 MG PO TABS: 25.0000 mg | ORAL_TABLET | Freq: Two times a day (BID) | ORAL | 3 refills | Status: AC

## 2023-09-21 ENCOUNTER — Encounter: Payer: Self-pay | Admitting: Family Medicine

## 2023-09-21 MED ORDER — WEGOVY 1 MG/0.5ML ~~LOC~~ SOAJ: 1.0000 mg | SUBCUTANEOUS | 11 refills | Status: DC

## 2023-09-21 NOTE — Addendum Note (Signed)
 Addended by: Wyn Heater on: 09/21/2023 10:27 AM   Modules accepted: Orders

## 2023-09-23 ENCOUNTER — Other Ambulatory Visit: Payer: Self-pay | Admitting: Family Medicine

## 2023-10-08 ENCOUNTER — Other Ambulatory Visit: Payer: Self-pay | Admitting: Family Medicine

## 2023-10-08 MED ORDER — OMEPRAZOLE 40 MG PO CPDR: 40.0000 mg | DELAYED_RELEASE_CAPSULE | Freq: Two times a day (BID) | ORAL | 1 refills | Status: DC

## 2023-10-08 MED ORDER — ROSUVASTATIN CALCIUM 10 MG PO TABS: 10.0000 mg | ORAL_TABLET | Freq: Every evening | ORAL | 1 refills | Status: DC

## 2023-10-08 MED ORDER — SUMATRIPTAN SUCCINATE 50 MG PO TABS: 50.0000 mg | ORAL_TABLET | ORAL | 2 refills | Status: DC | PRN

## 2023-10-08 MED ORDER — ROPINIROLE HCL 1 MG PO TABS
1.0000 mg | ORAL_TABLET | Freq: Every day | ORAL | 1 refills | Status: DC
Start: 2023-10-08 — End: 2023-10-11

## 2023-10-08 NOTE — Telephone Encounter (Signed)
 Last Fill: Omeprazole: Unk, Historical Provider     Rosuvastatin: Unk, Historical Provider     Ropinorole: Unk, Historical Provider     Sumatriptan: Unk, Historical Provider  Last OV: 09/20/23 Next OV: 01/26/24  Routing to provider for review/authorization.

## 2023-10-08 NOTE — Telephone Encounter (Signed)
 Copied from CRM [id number]. Topic: Clinical - Medication Refill >> Oct 08, 2023  1:20 PM Juleen Oakland F wrote: Medication: omeprazole (PRILOSEC) 40 MG capsule rosuvastatin (CRESTOR) 10 MG tablet  rOPINIRole (REQUIP) 1 MG table SUMAtriptan (IMITREX) 50 MG table  Has the patient contacted their pharmacy? Yes (Agent: If no, request that the patient contact the pharmacy for the refill. If patient does not wish to contact the pharmacy document the reason why and proceed with request.) (Agent: If yes, when and what did the pharmacy advise?)  This is the patient's preferred pharmacy:   [facility] - [place], [place] - [address] [place] [address] Phone: [phone] Fax: [phone]   Is this the correct pharmacy for this prescription? Yes If no, delete pharmacy and type the correct one.   Has the prescription been filled recently? Yes  Is the patient out of the medication? No  Has the patient been seen for an appointment in the last year OR does the patient have an upcoming appointment? Yes  Can we respond through MyChart? No  Agent: Please be advised that Rx refills may take up to 3 business days. We ask that you follow-up with your pharmacy.

## 2023-10-11 ENCOUNTER — Other Ambulatory Visit (HOSPITAL_COMMUNITY): Payer: Self-pay

## 2023-10-11 MED ORDER — ROSUVASTATIN CALCIUM 10 MG PO TABS: 10.0000 mg | ORAL_TABLET | Freq: Every evening | ORAL | 1 refills | Status: DC

## 2023-10-11 MED ORDER — ROPINIROLE HCL 1 MG PO TABS: 1.0000 mg | ORAL_TABLET | Freq: Every day | ORAL | 1 refills | Status: DC

## 2023-10-11 NOTE — Telephone Encounter (Signed)
 Received fax from [facility] stating patient's insurance will cover 100 day supply.  Refills resent to 100 day supply except for Sumatriptan .

## 2023-10-11 NOTE — Addendum Note (Signed)
 Addended by: Wyn Heater on: 10/11/2023 11:22 AM   Modules accepted: Orders

## 2023-11-02 DIAGNOSIS — H35363 Drusen (degenerative) of macula, bilateral: Secondary | ICD-10-CM | POA: Diagnosis not present

## 2023-11-02 DIAGNOSIS — H401134 Primary open-angle glaucoma, bilateral, indeterminate stage: Secondary | ICD-10-CM | POA: Diagnosis not present

## 2023-11-02 DIAGNOSIS — H35033 Hypertensive retinopathy, bilateral: Secondary | ICD-10-CM | POA: Diagnosis not present

## 2023-11-02 DIAGNOSIS — Z961 Presence of intraocular lens: Secondary | ICD-10-CM | POA: Diagnosis not present

## 2023-11-05 ENCOUNTER — Other Ambulatory Visit: Payer: Self-pay | Admitting: Family Medicine

## 2023-11-05 MED ORDER — CELECOXIB 100 MG PO CAPS: 100.0000 mg | ORAL_CAPSULE | Freq: Two times a day (BID) | ORAL | 1 refills | Status: DC

## 2023-11-05 MED ORDER — DULOXETINE HCL 60 MG PO CPEP: 60.0000 mg | ORAL_CAPSULE | Freq: Every evening | ORAL | 1 refills | Status: DC

## 2023-11-05 NOTE — Telephone Encounter (Signed)
 Copied from CRM [id number]. Topic: Clinical - Medication Refill >> Nov 05, 2023 10:28 AM [place] wrote: Medication: celecoxib (CELEBREX) 100 MG capsule ; DULoxetine (CYMBALTA) 60 MG capsule   Has the patient contacted their pharmacy? Yes (Agent: If no, request that the patient contact the pharmacy for the refill. If patient does not wish to contact the pharmacy document the reason why and proceed with request.) (Agent: If yes, when and what did the pharmacy advise?)  This is the patient's preferred pharmacy:  [facility] - [place], [place] - [address] [place]  [address] Phone: [phone] Fax: [phone]  Is this the correct pharmacy for this prescription? Yes If no, delete pharmacy and type the correct one.   Has the prescription been filled recently? Yes  Is the patient out of the medication? Yes  Has the patient been seen for an appointment in the last year OR does the patient have an upcoming appointment? Yes  Can we respond through MyChart? Yes  Agent: Please be advised that Rx refills may take up to 3 business days. We ask that you follow-up with your pharmacy.

## 2023-11-05 NOTE — Telephone Encounter (Signed)
 Last office visit 09/20/23 for Right shoulder Pain.  Last refilled ?Joyce York   Next Appt:  01/26/24 for 6 month follow up.

## 2023-11-08 ENCOUNTER — Other Ambulatory Visit: Payer: Self-pay | Admitting: Family Medicine

## 2023-11-12 ENCOUNTER — Telehealth: Payer: Self-pay

## 2023-11-12 NOTE — Telephone Encounter (Signed)
 Copied from CRM [id number]. Topic: General - Other >> Nov 12, 2023 12:52 PM Allyne Areola wrote: Reason for CRM: [facility] is calling to verify if a fax has been received for patient's CPAP supply, they will be faxing it over again today. Best call back [phone]. Call back to confirm.

## 2023-11-12 NOTE — Telephone Encounter (Signed)
 Called to let them know order received and will be addressed by pcp once back in office next week. Placed in PCP box for review.

## 2023-11-15 ENCOUNTER — Other Ambulatory Visit: Payer: Self-pay | Admitting: Family Medicine

## 2023-11-15 ENCOUNTER — Telehealth: Payer: Self-pay

## 2023-11-15 NOTE — Telephone Encounter (Signed)
 Noted.  Will be on the look out for paperwork from [employer].

## 2023-11-15 NOTE — Telephone Encounter (Signed)
 Last office visit 09/20/23 for shoulder pain.  Last refilled 09/13/2023 for #30 with 2 refills.  Next appt: 01/26/24 for 6 month follow up.

## 2023-11-15 NOTE — Telephone Encounter (Signed)
 Copied from CRM [id number]. Topic: General - Other >> Nov 15, 2023  9:44 AM Revonda D wrote: Reason for CRM: Pt stated that [employer] will now be her new suppliers for her Cpap supplies. Pt stated that [employer] will be reaching out to Dr.Copland to have him sign for the supplies.

## 2023-11-19 ENCOUNTER — Other Ambulatory Visit: Payer: Self-pay | Admitting: Family Medicine

## 2023-11-19 MED ORDER — DULOXETINE HCL 30 MG PO CPEP
30.0000 mg | ORAL_CAPSULE | Freq: Every evening | ORAL | 1 refills | Status: DC
Start: 2023-11-19 — End: 2024-03-31

## 2023-11-19 NOTE — Telephone Encounter (Signed)
 Copied from CRM [id number]. Topic: Clinical - Medication Refill >> Nov 19, 2023  2:43 PM Donna E wrote: Medication: DULoxetine  (CYMBALTA ) 30 MG capsule   Has the patient contacted their pharmacy? No the original prescription was with previous provider   This is the patient's preferred pharmacy:   [facility] - [place], [place] - [address] [place] [address] Phone: [phone] Fax: [phone]   Is this the correct pharmacy for this prescription? Yes If no, delete pharmacy and type the correct one.   Has the prescription been filled recently? No  last refilled 03/17/2023 in [place]    Is the patient out of the medication? No  Has the patient been seen for an appointment in the last year OR does the patient have an upcoming appointment? Yes  Can we respond through MyChart? Yes  Agent: Please be advised that Rx refills may take up to 3 business days. We ask that you follow-up with your pharmacy.

## 2023-11-19 NOTE — Telephone Encounter (Signed)
 Refill sent as requested.

## 2023-12-20 ENCOUNTER — Other Ambulatory Visit: Payer: Self-pay | Admitting: Family Medicine

## 2023-12-30 ENCOUNTER — Other Ambulatory Visit: Payer: Self-pay | Admitting: *Deleted

## 2023-12-30 MED ORDER — QVAR REDIHALER 40 MCG/ACT IN AERB: 1.0000 | INHALATION_SPRAY | Freq: Two times a day (BID) | RESPIRATORY_TRACT | 1 refills | Status: DC

## 2023-12-30 NOTE — Telephone Encounter (Signed)
 Copied from CRM #[id number]. Topic: Clinical - Medication Question >> Dec 30, 2023  3:47 PM [place] M wrote: Reason for CRM: Patient asking to get a refill of beclomethasone (QVAR  REDIHALER) 40 MCG/ACT inhaler, it was from her previous doctor in [place] -sent from [facility]

## 2023-12-30 NOTE — Telephone Encounter (Signed)
 Refill sent as requested.

## 2024-01-07 ENCOUNTER — Other Ambulatory Visit: Payer: Self-pay | Admitting: Family Medicine

## 2024-01-19 MED ORDER — ROPINIROLE HCL 1 MG PO TABS: 1.5000 mg | ORAL_TABLET | Freq: Every day | ORAL | 0 refills | Status: DC

## 2024-01-19 NOTE — Addendum Note (Signed)
 Addended by: WENDELL ARLAND RAMAN on: 01/19/2024 10:51 AM   Modules accepted: Orders

## 2024-01-24 NOTE — Progress Notes (Signed)
 "    Zyheir Daft T. Shanen Norris, MD, CAQ Sports Medicine [facility] at [facility] [address] [place], [address]  Phone: [phone]  FAX: [phone]  Joyce York - 73 y.o. female  MRN [id number]  Date of Birth: 1950/07/13  Date: 01/26/2024  PCP: Watt Mirza, MD  Referral: Watt Mirza, MD  Chief Complaint  Patient presents with   Medical Management of Chronic Issues    6 month follow up   Subjective:   Joyce York is a 73 y.o. very pleasant female patient with Body mass index is 32.81 kg/m. who presents with the following:  Discussed the use of AI scribe software for clinical note transcription with the patient, who gave verbal consent to proceed.  This is a patient who is new to me on March 2025.  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  She has a lot of stress caring for her husband who has dementia, and she is a retired therapist, music.  History of asthma on Qvar   History of TIA on aspirin, BP and cholesterol control  Depression for many years.  She was on Cymbalta  90 mg and at abrupt discontinuation caused psychotic symptoms.  Currently she is doing well.  Intermittent migraines.  HTN: Tolerating all medications without side effects Stable and at goal No CP, no sob. No HA.  BP Readings from Last 3 Encounters:  01/26/24 110/70  09/20/23 110/66  07/26/23 132/74    Basic Metabolic Panel:    Component Value Date/Time   NA 134 (L) 06/22/2019 2130   K 4.1 06/22/2019 2130   CL 101 06/22/2019 2130   CO2 25 06/22/2019 2130   BUN 17 06/22/2019 2130   CREATININE 0.78 06/22/2019 2130   GLUCOSE 98 06/22/2019 2130   CALCIUM  9.7 06/22/2019 2130     History of Present Illness Joyce York is a 73 year old female who presents for follow-up and management of weight loss and ingrown toenail.  She has an ingrown toenail causing pain at the corner of the nail. She manages it by placing cotton underneath  and having pedicure changes the nail but finds it challenging to manage on her own.  She has been on Wegovy  for six months, resulting in a weight loss of 35-37 pounds, with her current weight at 168 pounds. She experiences nausea once a week and occasional constipation. She is considering switching to Mounjaro due to availability but is concerned about cost and insurance coverage.  She has a history of a 2.7 cm left adrenal nodule discovered incidentally during a workup for abdominal pain and vomiting. An MRI was performed last October, and a follow-up was recommended within a year to monitor for changes in size.  She experiences arthritis in her feet, limiting her ability to walk for exercise. She engages in water activities at her daughter's pool once a week to stay active and is exploring other low-impact exercise options due to arthritis-related pain.  She has a history of migraines and low back pain, for which she uses cyclobenzaprine  as a muscle relaxant, finding it helpful in managing these conditions.  She recently had a cold but managed without using a nebulizer. Her breathing is currently stable.    Review of Systems is noted in the HPI, as appropriate  Objective:   BP 110/70   Pulse 75   Temp 97.7 F (36.5 C) (Temporal)   Ht 5' (1.524 m)   Wt 168 lb (76.2 kg)   SpO2 97%  BMI 32.81 kg/m   Weight in (lb) to have BMI = 25: 127.7    GEN: WDWN, NAD, Non-toxic HEENT: Atraumatic, Normocephalic. Neck supple. No masses. CV: RRR, No M/G/R. No JVD. No thrill. No extra heart sounds. PULM: CTA B, no wheezes, crackles, rhonchi. No retractions. No resp. distress. No accessory muscle use. EXTR: No c/c/e NEURO Normal gait.  PSYCH: Normally interactive. Conversant.    Right medial nail with some ingrown characteristics and tenderness to palpation medially  Physical Exam MEASUREMENTS: Weight- 168.  Laboratory and Imaging Data:  Assessment and Plan:     ICD-10-CM   1. Other  specified hypothyroidism  E03.8 TSH    T3, free    T4, free    2. TIA (transient ischemic attack)  G45.9     3. Mixed hyperlipidemia  E78.2 Lipid panel    4. Mild intermittent asthma without complication  J45.20     5. Major depressive disorder, single episode, in remission (HCC)  F32.5     6. Essential hypertension  I10     7. GAD (generalized anxiety disorder)  F41.1     8. Elevated blood sugar  R73.9 Basic metabolic panel with GFR    Hemoglobin A1c    9. Encounter for long-term (current) use of medications  Z79.899 CBC with Differential/Platelet    Hepatic function panel    10. Vitamin D  deficiency  E55.9 VITAMIN D  25 Hydroxy (Vit-D Deficiency, Fractures)    11. Left adrenal mass (HCC)  E27.8 MR Abdomen W Wo Contrast    12. Need for influenza vaccination  Z23 Flu vaccine HIGH DOSE PF(Fluzone Trivalent)     Assessment & Plan Ingrown toenail Ingrown toenail at the corner of the nail without infection. - Advise continuation of retraction techniques and use of dental floss or cotton to lift the nail edge. - Consider podiatry referral if condition does not improve or becomes infected.  Obesity, status post significant weight loss with GLP-1 therapy Lost 35-37 pounds with Wegovy  over six months. Considering switch to Mounjaro due to availability and potential for fewer side effects. - Switch to Mounjaro (Zepbound) 5.0 mg once weekly when next Wegovy  dose is due. - Monitor for side effects and efficacy of Mounjaro.  Chronic low back pain and migraine Managed with cyclobenzaprine , providing relief. - Send cyclobenzaprine  prescription to [facility] pharmacy.  Osteoarthritis of the feet Causes pain, limiting walking activities. Engages in water exercises for low-impact activity. - Encourage continuation of water exercises and consider exploring local pool options for regular activity.  Essential hypertension Blood pressure is well-controlled.  Asthma, mild intermittent Managed  without nebulizer use despite recent cold.  Hypothyroidism - Order blood work to check free T3, T4, and TSH levels.  Left adrenal nodule under surveillance 2.7 cm left adrenal nodule with previous MRI recommending follow-up within one year. - Order MRI of the abdomen with and without contrast to reassess the adrenal nodule. - Coordinate with [facility] facility for imaging.  Mixed hyperlipidemia - Order blood work to check cholesterol levels.   Medication Management during today's office visit: Meds ordered this encounter  Medications   cyclobenzaprine  (FLEXERIL ) 10 MG tablet    Sig: Take 1 tablet (10 mg total) by mouth every 8 (eight) hours as needed (muscle tension).    Dispense:  90 tablet    Refill:  1   Medications Discontinued During This Encounter  Medication Reason   cyclobenzaprine  (FLEXERIL ) 10 MG tablet Reorder    Orders placed today for conditions managed today:  Orders Placed This Encounter  Procedures   MR Abdomen W Wo Contrast   Flu vaccine HIGH DOSE PF(Fluzone Trivalent)   HM HEPATITIS C SCREENING LAB   Basic metabolic panel with GFR   CBC with Differential/Platelet   Hepatic function panel   Hemoglobin A1c   Lipid panel   TSH   T3, free   T4, free   VITAMIN D  25 Hydroxy (Vit-D Deficiency, Fractures)    Disposition: Return in about 6 months (around 07/25/2024) for Complete physical.  Dr[employer] One speech-to-text software was used for transcription in this dictation.  Possible transcriptional errors can occur using Animal nutritionist.   Signed,  Jacques DASEN. Anastashia Westerfeld, MD   Outpatient Encounter Medications as of 01/26/2024  Medication Sig   acetaminophen (TYLENOL) 500 MG tablet Take 1,000 mg by mouth every 8 (eight) hours as needed.   ALBUTEROL IN 1-2 puffs every 4 (four) hours as needed.   Ascorbic Acid (VITAMIN C) 1000 MG tablet Take 1,000 mg by mouth daily. Liposomal   aspirin EC 81 MG tablet Take 81 mg by mouth every other day.  Swallow whole.   beclomethasone (QVAR  REDIHALER) 40 MCG/ACT inhaler Inhale 1-2 puffs into the lungs 2 (two) times daily.   carvedilol  (COREG ) 25 MG tablet Take 1 tablet (25 mg total) by mouth 2 (two) times daily with a meal.   celecoxib  (CELEBREX ) 100 MG capsule Take 1 capsule (100 mg total) by mouth 2 (two) times daily.   Cholecalciferol (VITAMIN D3) 50 MCG (2000 UT) TABS Take 1 tablet by mouth daily.   DULoxetine  (CYMBALTA ) 30 MG capsule Take 1 capsule (30 mg total) by mouth every evening.   DULoxetine  (CYMBALTA ) 60 MG capsule Take 1 capsule (60 mg total) by mouth every evening. Take with 30 mg for 90 mg daily   fluticasone (FLONASE) 50 MCG/ACT nasal spray Place 2 sprays into both nostrils daily.   hydrOXYzine (ATARAX) 25 MG tablet TAKE 1 TO 2 TABS BY MOUTH AS NEEDED FOR INSOMNIA   levothyroxine  (SYNTHROID ) 100 MCG tablet Take 1 tablet (100 mcg total) by mouth daily before breakfast.   losartan  (COZAAR ) 100 MG tablet Take 1 tablet (100 mg total) by mouth every evening.   Misc Natural Products (TURMERIC CURCUMIN) CAPS Take 1 capsule by mouth daily. 2000 mg daily (Liposomal)   Multiple Vitamins-Minerals (ICAPS AREDS 2 PO) Take 1 capsule by mouth daily.   omeprazole  (PRILOSEC) 40 MG capsule TAKE 1 CAPSULE BY MOUTH TWICE  DAILY   ondansetron (ZOFRAN) 4 MG tablet Take 4 mg by mouth every 6 (six) hours as needed for nausea or vomiting.   Polyethylene Glycol 3350 (MIRALAX PO) Take by mouth every other day.   Probiotic Product (PROBIOTIC PO) Take 1 capsule by mouth daily.   rOPINIRole  (REQUIP ) 1 MG tablet Take 1.5 tablets (1.5 mg total) by mouth at bedtime.   rosuvastatin  (CRESTOR ) 10 MG tablet Take 1 tablet (10 mg total) by mouth every evening.   sennosides-docusate sodium (SENOKOT-S) 8.6-50 MG tablet Take 1-2 tablets by mouth daily as needed for constipation.   sucralfate (CARAFATE) 1 g tablet Take 1 g by mouth 2 (two) times daily as needed (Stomach Pain).   SUMAtriptan   (IMITREX ) 50 MG tablet Take 1 tablet (50 mg total) by mouth every 2 (two) hours as needed for migraine. May repeat in 2 hours if headache persists or recurs.   verapamil  (VERELAN ) 240 MG 24 hr capsule Take 1 capsule (240 mg total) by mouth every morning.   WEGOVY  1  MG/0.5ML SOAJ Inject 1 mg into the skin once a week.   [DISCONTINUED] cyclobenzaprine  (FLEXERIL ) 10 MG tablet TAKE 1 TABLET (10 MG TOTAL) BY MOUTH EVERY 8 (EIGHT) HOURS AS NEEDED (MUSCLE TENSION).   cyclobenzaprine  (FLEXERIL ) 10 MG tablet Take 1 tablet (10 mg total) by mouth every 8 (eight) hours as needed (muscle tension).   No facility-administered encounter medications on file as of 01/26/2024.   "

## 2024-01-26 ENCOUNTER — Ambulatory Visit (INDEPENDENT_AMBULATORY_CARE_PROVIDER_SITE_OTHER): Admitting: Family Medicine

## 2024-01-26 ENCOUNTER — Encounter: Payer: Self-pay | Admitting: Family Medicine

## 2024-01-26 VITALS — BP 110/70 | HR 75 | Temp 97.7°F | Ht 60.0 in | Wt 168.0 lb

## 2024-01-26 DIAGNOSIS — R739 Hyperglycemia, unspecified: Secondary | ICD-10-CM

## 2024-01-26 DIAGNOSIS — G459 Transient cerebral ischemic attack, unspecified: Secondary | ICD-10-CM

## 2024-01-26 DIAGNOSIS — E278 Other specified disorders of adrenal gland: Secondary | ICD-10-CM | POA: Diagnosis not present

## 2024-01-26 DIAGNOSIS — J4520 Mild intermittent asthma, uncomplicated: Secondary | ICD-10-CM

## 2024-01-26 DIAGNOSIS — E782 Mixed hyperlipidemia: Secondary | ICD-10-CM

## 2024-01-26 DIAGNOSIS — Z79899 Other long term (current) drug therapy: Secondary | ICD-10-CM

## 2024-01-26 DIAGNOSIS — Z23 Encounter for immunization: Secondary | ICD-10-CM | POA: Diagnosis not present

## 2024-01-26 DIAGNOSIS — E038 Other specified hypothyroidism: Secondary | ICD-10-CM | POA: Diagnosis not present

## 2024-01-26 DIAGNOSIS — F325 Major depressive disorder, single episode, in full remission: Secondary | ICD-10-CM

## 2024-01-26 DIAGNOSIS — E559 Vitamin D deficiency, unspecified: Secondary | ICD-10-CM | POA: Diagnosis not present

## 2024-01-26 DIAGNOSIS — I10 Essential (primary) hypertension: Secondary | ICD-10-CM

## 2024-01-26 DIAGNOSIS — F411 Generalized anxiety disorder: Secondary | ICD-10-CM

## 2024-01-26 LAB — CBC WITH DIFFERENTIAL/PLATELET
Basophils Absolute: 0.1 K/uL (ref 0.0–0.1)
Basophils Relative: 1.2 % (ref 0.0–3.0)
Eosinophils Absolute: 0 K/uL (ref 0.0–0.7)
Eosinophils Relative: 0.7 % (ref 0.0–5.0)
HCT: 40.4 % (ref 36.0–46.0)
Hemoglobin: 13.3 g/dL (ref 12.0–15.0)
Lymphocytes Relative: 12 % (ref 12.0–46.0)
Lymphs Abs: 0.7 K/uL (ref 0.7–4.0)
MCHC: 32.9 g/dL (ref 30.0–36.0)
MCV: 93.2 fl (ref 78.0–100.0)
Monocytes Absolute: 0.9 K/uL (ref 0.1–1.0)
Monocytes Relative: 14 % — ABNORMAL HIGH (ref 3.0–12.0)
Neutro Abs: 4.4 K/uL (ref 1.4–7.7)
Neutrophils Relative %: 72.1 % (ref 43.0–77.0)
Platelets: 320 K/uL (ref 150.0–400.0)
RBC: 4.33 Mil/uL (ref 3.87–5.11)
RDW: 14.2 % (ref 11.5–15.5)
WBC: 6.1 K/uL (ref 4.0–10.5)

## 2024-01-26 LAB — BASIC METABOLIC PANEL WITH GFR
BUN: 14 mg/dL (ref 6–23)
CO2: 28 meq/L (ref 19–32)
Calcium: 9.2 mg/dL (ref 8.4–10.5)
Chloride: 97 meq/L (ref 96–112)
Creatinine, Ser: 0.78 mg/dL (ref 0.40–1.20)
GFR: 76.1 mL/min (ref >60.00)
Glucose, Bld: 76 mg/dL (ref 70–99)
Potassium: 4.9 meq/L (ref 3.5–5.1)
Sodium: 132 meq/L — ABNORMAL LOW (ref 135–145)

## 2024-01-26 LAB — HEPATIC FUNCTION PANEL
ALT: 20 U/L (ref 0–35)
AST: 21 U/L (ref 0–37)
Albumin: 3.9 g/dL (ref 3.5–5.2)
Alkaline Phosphatase: 106 U/L (ref 39–117)
Bilirubin, Direct: 0.2 mg/dL (ref 0.0–0.3)
Total Bilirubin: 0.7 mg/dL (ref 0.2–1.2)
Total Protein: 6.3 g/dL (ref 6.0–8.3)

## 2024-01-26 LAB — LIPID PANEL
Cholesterol: 147 mg/dL (ref 0–200)
HDL: 63.3 mg/dL (ref >39.00)
LDL Cholesterol: 72 mg/dL (ref 0–99)
NonHDL: 83.7
Total CHOL/HDL Ratio: 2
Triglycerides: 61 mg/dL (ref 0.0–149.0)
VLDL: 12.2 mg/dL (ref 0.0–40.0)

## 2024-01-26 LAB — T4, FREE: Free T4: 1.08 ng/dL (ref 0.60–1.60)

## 2024-01-26 LAB — T3, FREE: T3, Free: 3 pg/mL (ref 2.3–4.2)

## 2024-01-26 LAB — HEMOGLOBIN A1C: Hgb A1c MFr Bld: 5.6 % (ref 4.6–6.5)

## 2024-01-26 LAB — TSH: TSH: 0.58 u[IU]/mL (ref 0.35–5.50)

## 2024-01-26 LAB — VITAMIN D 25 HYDROXY (VIT D DEFICIENCY, FRACTURES): VITD: 43.47 ng/mL (ref 30.00–100.00)

## 2024-01-26 MED ORDER — CYCLOBENZAPRINE HCL 10 MG PO TABS: 10.0000 mg | ORAL_TABLET | Freq: Three times a day (TID) | ORAL | 1 refills | Status: DC | PRN

## 2024-01-27 ENCOUNTER — Ambulatory Visit: Payer: Self-pay | Admitting: Family Medicine

## 2024-01-27 ENCOUNTER — Other Ambulatory Visit: Payer: Self-pay | Admitting: Family Medicine

## 2024-01-27 DIAGNOSIS — I10 Essential (primary) hypertension: Secondary | ICD-10-CM

## 2024-01-27 MED ORDER — LOSARTAN POTASSIUM 100 MG PO TABS: 100.0000 mg | ORAL_TABLET | Freq: Every evening | ORAL | 2 refills | Status: AC

## 2024-01-27 NOTE — Telephone Encounter (Signed)
 Copied from CRM #[id number]. Topic: Clinical - Medication Refill >> Jan 27, 2024 12:16 PM Suzen RAMAN wrote: Medication: losartan  (COZAAR ) 100 MG tablet 100 days supply  Has the patient contacted their pharmacy? Yes  This is the patient's preferred pharmacy:  [facility] - [place], [place] - [address] [place] [address] Phone: [phone] Fax: [phone]  Is this the correct pharmacy for this prescription? Yes If no, delete pharmacy and type the correct one.   Has the prescription been filled recently? No  Is the patient out of the medication? No  Has the patient been seen for an appointment in the last year OR does the patient have an upcoming appointment? Yes  Can we respond through MyChart? Yes  Agent: Please be advised that Rx refills may take up to 3 business days. We ask that you follow-up with your pharmacy.

## 2024-02-08 NOTE — Progress Notes (Unsigned)
 Joyce Oscar T. Cordero Surette, MD, CAQ Sports Medicine [facility] at [facility] [address] [place], [address]  Phone: [phone]  FAX: [phone]  Joyce York - 73 y.o. female  MRN [id number]  Date of Birth: 10/20/1950  Date: 02/10/2024  PCP: Watt Mirza, MD  Referral: Watt Mirza, MD  No chief complaint on file.  Subjective:   Joyce York is a 73 y.o. very pleasant female patient with There is no height or weight on file to calculate BMI. who presents with the following:  Discussed the use of AI scribe software for clinical note transcription with the patient, who gave verbal consent to proceed.  She is a very nice patient who I recall well who presents with some ongoing knee pain and arthritis. History of Present Illness     Review of Systems is noted in the HPI, as appropriate  Objective:   There were no vitals taken for this visit.  GEN: No acute distress; alert,appropriate. PULM: Breathing comfortably in no respiratory distress PSYCH: Normally interactive.   Physical Exam   Laboratory and Imaging Data:  Assessment and Plan:   No diagnosis found. Assessment & Plan   Medication Management during today's office visit: No orders of the defined types were placed in this encounter.  There are no discontinued medications.  Orders placed today for conditions managed today: No orders of the defined types were placed in this encounter.   Disposition: No follow-ups on file.  Dragon Medical One speech-to-text software was used for transcription in this dictation.  Possible transcriptional errors can occur using Animal nutritionist.   Signed,  Mirza DASEN. Valery Amedee, MD   Outpatient Encounter Medications as of 02/10/2024  Medication Sig   acetaminophen (TYLENOL) 500 MG tablet Take 1,000 mg by mouth every 8 (eight) hours as needed.   ALBUTEROL IN 1-2 puffs every 4 (four) hours as needed.   Ascorbic Acid (VITAMIN  C) 1000 MG tablet Take 1,000 mg by mouth daily. Liposomal   aspirin EC 81 MG tablet Take 81 mg by mouth every other day. Swallow whole.   beclomethasone (QVAR  REDIHALER) 40 MCG/ACT inhaler Inhale 1-2 puffs into the lungs 2 (two) times daily.   carvedilol  (COREG ) 25 MG tablet Take 1 tablet (25 mg total) by mouth 2 (two) times daily with a meal.   celecoxib  (CELEBREX ) 100 MG capsule Take 1 capsule (100 mg total) by mouth 2 (two) times daily.   Cholecalciferol (VITAMIN D3) 50 MCG (2000 UT) TABS Take 1 tablet by mouth daily.   cyclobenzaprine  (FLEXERIL ) 10 MG tablet Take 1 tablet (10 mg total) by mouth every 8 (eight) hours as needed (muscle tension).   DULoxetine  (CYMBALTA ) 30 MG capsule Take 1 capsule (30 mg total) by mouth every evening.   DULoxetine  (CYMBALTA ) 60 MG capsule Take 1 capsule (60 mg total) by mouth every evening. Take with 30 mg for 90 mg daily   fluticasone (FLONASE) 50 MCG/ACT nasal spray Place 2 sprays into both nostrils daily.   hydrOXYzine (ATARAX) 25 MG tablet TAKE 1 TO 2 TABS BY MOUTH AS NEEDED FOR INSOMNIA   levothyroxine  (SYNTHROID ) 100 MCG tablet Take 1 tablet (100 mcg total) by mouth daily before breakfast.   losartan  (COZAAR ) 100 MG tablet Take 1 tablet (100 mg total) by mouth every evening.   Misc Natural Products (TURMERIC CURCUMIN) CAPS Take 1 capsule by mouth daily. 2000 mg daily (Liposomal)   Multiple Vitamins-Minerals (ICAPS AREDS 2 PO) Take 1 capsule by mouth  daily.   omeprazole  (PRILOSEC) 40 MG capsule TAKE 1 CAPSULE BY MOUTH TWICE  DAILY   ondansetron (ZOFRAN) 4 MG tablet Take 4 mg by mouth every 6 (six) hours as needed for nausea or vomiting.   Polyethylene Glycol 3350 (MIRALAX PO) Take by mouth every other day.   Probiotic Product (PROBIOTIC PO) Take 1 capsule by mouth daily.   rOPINIRole  (REQUIP ) 1 MG tablet Take 1.5 tablets (1.5 mg total) by mouth at bedtime.   rosuvastatin  (CRESTOR ) 10 MG tablet Take 1 tablet (10 mg total) by mouth every evening.    sennosides-docusate sodium (SENOKOT-S) 8.6-50 MG tablet Take 1-2 tablets by mouth daily as needed for constipation.   sucralfate (CARAFATE) 1 g tablet Take 1 g by mouth 2 (two) times daily as needed (Stomach Pain).   SUMAtriptan  (IMITREX ) 50 MG tablet Take 1 tablet (50 mg total) by mouth every 2 (two) hours as needed for migraine. May repeat in 2 hours if headache persists or recurs.   verapamil  (VERELAN ) 240 MG 24 hr capsule Take 1 capsule (240 mg total) by mouth every morning.   WEGOVY  1 MG/0.5ML SOAJ Inject 1 mg into the skin once a week.   No facility-administered encounter medications on file as of 02/10/2024.

## 2024-02-10 ENCOUNTER — Encounter: Payer: Self-pay | Admitting: Family Medicine

## 2024-02-10 ENCOUNTER — Ambulatory Visit: Admitting: Family Medicine

## 2024-02-10 VITALS — BP 150/80 | HR 85 | Temp 97.7°F | Ht 60.0 in | Wt 167.4 lb

## 2024-02-10 DIAGNOSIS — M1712 Unilateral primary osteoarthritis, left knee: Secondary | ICD-10-CM | POA: Diagnosis not present

## 2024-02-10 MED ORDER — TRIAMCINOLONE ACETONIDE 40 MG/ML IJ SUSP
40.0000 mg | Freq: Once | INTRAMUSCULAR | Status: AC
  Administered 2024-02-10: 40 mg via INTRA_ARTICULAR

## 2024-02-20 NOTE — Progress Notes (Addendum)
 Dametra Whetsel T. Loreta Blouch, MD, CAQ Sports Medicine [facility] at [facility] [address] [place], [address]  Phone: [phone]  FAX: [phone]  Joyce York - 73 y.o. female  MRN [id number]  Date of Birth: 09-09-1950  Date: 02/21/2024  PCP: Watt Mirza, MD  Referral: Watt Mirza, MD  Chief Complaint  Patient presents with   Knee Pain    Right      ICD-10-CM   1. Primary osteoarthritis of right knee  M17.11      Procedure only Assessment & Plan  Aspiration/Injection Procedure Note Tasfia Vasseur 1950-09-18 Date of procedure: 02/21/2024  Procedure: Large Joint Aspiration / Injection of Knee, R Indications: Pain  Procedure Details Patient verbally consented to procedure. Risks, benefits, and alternatives explained. Sterilely prepped with Chloraprep. Ethyl cholride used for anesthesia. 9 cc Lidocaine 1% mixed with 1 mL of Kenalog  40 mg injected using the anteromedial approach without difficulty. No complications with procedure and tolerated well. Patient had decreased pain post-injection. Medication: 1 mL of Kenalog  40 mg   Medication Management during today's office visit: No orders of the defined types were placed in this encounter.  There are no discontinued medications.  Orders placed today for conditions managed today: No orders of the defined types were placed in this encounter.   Disposition: No follow-ups on file.  Dragon Medical One speech-to-text software was used for transcription in this dictation.  Possible transcriptional errors can occur using Animal nutritionist.   Signed,  Mirza DASEN. Danna Sewell, MD   Outpatient Encounter Medications as of 02/21/2024  Medication Sig   acetaminophen (TYLENOL) 500 MG tablet Take 1,000 mg by mouth every 8 (eight) hours as needed.   ALBUTEROL IN 1-2 puffs every 4 (four) hours as needed.   Ascorbic Acid (VITAMIN C) 1000 MG tablet Take 1,000 mg by mouth daily. Liposomal    aspirin EC 81 MG tablet Take 81 mg by mouth every other day. Swallow whole.   beclomethasone (QVAR  REDIHALER) 40 MCG/ACT inhaler Inhale 1-2 puffs into the lungs 2 (two) times daily.   carvedilol  (COREG ) 25 MG tablet Take 1 tablet (25 mg total) by mouth 2 (two) times daily with a meal.   celecoxib  (CELEBREX ) 100 MG capsule Take 1 capsule (100 mg total) by mouth 2 (two) times daily.   Cholecalciferol (VITAMIN D3) 50 MCG (2000 UT) TABS Take 1 tablet by mouth daily.   cyclobenzaprine  (FLEXERIL ) 10 MG tablet Take 1 tablet (10 mg total) by mouth every 8 (eight) hours as needed (muscle tension).   DULoxetine  (CYMBALTA ) 30 MG capsule Take 1 capsule (30 mg total) by mouth every evening.   DULoxetine  (CYMBALTA ) 60 MG capsule Take 1 capsule (60 mg total) by mouth every evening. Take with 30 mg for 90 mg daily   fluticasone (FLONASE) 50 MCG/ACT nasal spray Place 2 sprays into both nostrils daily.   hydrOXYzine (ATARAX) 25 MG tablet TAKE 1 TO 2 TABS BY MOUTH AS NEEDED FOR INSOMNIA   levothyroxine  (SYNTHROID ) 100 MCG tablet Take 1 tablet (100 mcg total) by mouth daily before breakfast.   losartan  (COZAAR ) 100 MG tablet Take 1 tablet (100 mg total) by mouth every evening.   Misc Natural Products (TURMERIC CURCUMIN) CAPS Take 1 capsule by mouth daily. 2000 mg daily (Liposomal)   Multiple Vitamins-Minerals (ICAPS AREDS 2 PO) Take 1 capsule by mouth daily.   omeprazole  (PRILOSEC) 40 MG capsule TAKE 1 CAPSULE BY MOUTH TWICE  DAILY   ondansetron (ZOFRAN) 4 MG  tablet Take 4 mg by mouth every 6 (six) hours as needed for nausea or vomiting.   Polyethylene Glycol 3350 (MIRALAX PO) Take by mouth every other day.   Probiotic Product (PROBIOTIC PO) Take 1 capsule by mouth daily.   rOPINIRole  (REQUIP ) 1 MG tablet Take 1.5 tablets (1.5 mg total) by mouth at bedtime.   rosuvastatin  (CRESTOR ) 10 MG tablet Take 1 tablet (10 mg total) by mouth every evening.   sennosides-docusate sodium (SENOKOT-S) 8.6-50 MG tablet Take 1-2  tablets by mouth daily as needed for constipation.   sucralfate (CARAFATE) 1 g tablet Take 1 g by mouth 2 (two) times daily as needed (Stomach Pain).   SUMAtriptan  (IMITREX ) 50 MG tablet Take 1 tablet (50 mg total) by mouth every 2 (two) hours as needed for migraine. May repeat in 2 hours if headache persists or recurs.   verapamil  (VERELAN ) 240 MG 24 hr capsule Take 1 capsule (240 mg total) by mouth every morning.   WEGOVY  1 MG/0.5ML SOAJ Inject 1 mg into the skin once a week.   No facility-administered encounter medications on file as of 02/21/2024.

## 2024-02-21 ENCOUNTER — Ambulatory Visit (INDEPENDENT_AMBULATORY_CARE_PROVIDER_SITE_OTHER): Admitting: Family Medicine

## 2024-02-21 ENCOUNTER — Encounter: Payer: Self-pay | Admitting: Family Medicine

## 2024-02-21 VITALS — BP 122/70 | HR 78 | Temp 98.0°F | Ht 60.0 in | Wt 167.0 lb

## 2024-02-21 DIAGNOSIS — M1711 Unilateral primary osteoarthritis, right knee: Secondary | ICD-10-CM

## 2024-02-21 MED ORDER — TRIAMCINOLONE ACETONIDE 40 MG/ML IJ SUSP
40.0000 mg | Freq: Once | INTRAMUSCULAR | Status: AC
  Administered 2024-02-21: 40 mg via INTRA_ARTICULAR

## 2024-02-21 NOTE — Addendum Note (Signed)
 Addended by: WENDELL ARLAND RAMAN on: 02/21/2024 03:32 PM   Modules accepted: Orders

## 2024-02-22 MED ORDER — WEGOVY 1.7 MG/0.75ML ~~LOC~~ SOAJ: 1.7000 mg | SUBCUTANEOUS | 5 refills | Status: AC

## 2024-02-22 NOTE — Addendum Note (Signed)
 Addended by: WATT MIRZA on: 02/22/2024 08:37 AM   Modules accepted: Orders

## 2024-03-02 DIAGNOSIS — H35363 Drusen (degenerative) of macula, bilateral: Secondary | ICD-10-CM | POA: Diagnosis not present

## 2024-03-02 DIAGNOSIS — H401134 Primary open-angle glaucoma, bilateral, indeterminate stage: Secondary | ICD-10-CM | POA: Diagnosis not present

## 2024-03-02 DIAGNOSIS — Z961 Presence of intraocular lens: Secondary | ICD-10-CM | POA: Diagnosis not present

## 2024-03-02 DIAGNOSIS — H35033 Hypertensive retinopathy, bilateral: Secondary | ICD-10-CM | POA: Diagnosis not present

## 2024-03-06 ENCOUNTER — Ambulatory Visit
Admission: RE | Admit: 2024-03-06 | Discharge: 2024-03-06 | Disposition: A | Discharge status: Home / Self Care | Source: Ambulatory Visit | Attending: Family Medicine | Admitting: Family Medicine

## 2024-03-06 DIAGNOSIS — K5730 Diverticulosis of large intestine without perforation or abscess without bleeding: Secondary | ICD-10-CM | POA: Diagnosis not present

## 2024-03-06 DIAGNOSIS — E278 Other specified disorders of adrenal gland: Secondary | ICD-10-CM | POA: Insufficient documentation

## 2024-03-06 DIAGNOSIS — Z9049 Acquired absence of other specified parts of digestive tract: Secondary | ICD-10-CM | POA: Diagnosis not present

## 2024-03-06 MED ORDER — GADOBUTROL 1 MMOL/ML IV SOLN
7.0000 mL | Freq: Once | INTRAVENOUS | Status: AC | PRN
Start: 2024-03-06 — End: 2024-03-06
  Administered 2024-03-06: 7 mL via INTRAVENOUS

## 2024-03-07 ENCOUNTER — Ambulatory Visit

## 2024-03-23 ENCOUNTER — Ambulatory Visit: Payer: Self-pay

## 2024-03-23 NOTE — Telephone Encounter (Signed)
 Please call and scheduled appointment with Dr. Watt next week for shoulder injection.

## 2024-03-23 NOTE — Telephone Encounter (Signed)
  FYI Only or Action Required?: Action required by provider: request for appointment and update on patient condition. Patient would like to schedule shoulder injection with Dr. Watt  Patient was last seen in primary care on 02/21/2024 by Joyce Mirza, MD.  Called Nurse Triage reporting Shoulder Pain.  Symptoms began about a month ago.  Interventions attempted: Rest, hydration, or home remedies.  Symptoms are: gradually worsening.  Triage Disposition: See PCP When Office is Open (Within 3 Days)  Patient/caregiver understands and will follow disposition?: Yes Copied from CRM #[id number]. Topic: Clinical - Red Word Triage >> Mar 23, 2024 12:40 PM Paige D wrote: Red Word that prompted transfer to Nurse Triage:   pain in R shoulder Reason for Disposition  [1] MODERATE pain (e.g., interferes with normal activities) AND [2] present > 3 days  Answer Assessment - Initial Assessment Questions 1. ONSET: When did the pain start?     Pain flared up about one month ago 2. LOCATION: Where is the pain located?     Right shoulder 3. PAIN: How bad is the pain? (Scale 1-10; or mild, moderate, severe)     6/10 4. WORK OR EXERCISE: Has there been any recent work or exercise that involved this part of the body?     denies 5. CAUSE: What do you think is causing the shoulder pain?     arthritis 6. OTHER SYMPTOMS: Do you have any other symptoms? (e.g., neck pain, swelling, rash, fever, numbness, weakness)     denies  Protocols used: Shoulder Pain-A-AH

## 2024-03-24 ENCOUNTER — Ambulatory Visit: Admitting: Clinical

## 2024-03-27 ENCOUNTER — Other Ambulatory Visit: Payer: Self-pay | Admitting: Family Medicine

## 2024-03-27 NOTE — Telephone Encounter (Signed)
 Message from pharmacy:  Please send a replace/new response with 100-Day Supply if appropriate to maximize member benefit. Requesting 1 year supply.   Celebrex  Last filled:  01/25/24, #180 Last OV:  02/21/24, OA of R knee Next OV:  03/29/24, shoulder injection

## 2024-03-28 ENCOUNTER — Other Ambulatory Visit: Payer: Self-pay | Admitting: Family Medicine

## 2024-03-28 NOTE — Progress Notes (Unsigned)
 Kelly Ranieri T. Estreya Clay, MD, CAQ Sports Medicine [facility] at [facility] [address] [place], [address]  Phone: [phone]  FAX: [phone]  Joyce York - 73 y.o. female  MRN [id number]  Date of Birth: 1951/04/10  Date: 03/29/2024  PCP: Watt Mirza, MD  Referral: Watt Mirza, MD  No chief complaint on file.  Subjective:   Joyce York is a 73 y.o. very pleasant female patient with There is no height or weight on file to calculate BMI. who presents with the following:  Discussed the use of AI scribe software for clinical note transcription with the patient, who gave verbal consent to proceed.  This is a very pleasant patient.  I saw her for some glenohumeral arthritis and April 2025, at that point in the right side glenohumeral joint injection. History of Present Illness     Review of Systems is noted in the HPI, as appropriate  Objective:   There were no vitals taken for this visit.  GEN: No acute distress; alert,appropriate. PULM: Breathing comfortably in no respiratory distress PSYCH: Normally interactive.   Laboratory and Imaging Data:  Assessment and Plan:   No diagnosis found. Assessment & Plan   Medication Management during today's office visit: No orders of the defined types were placed in this encounter.  There are no discontinued medications.  Orders placed today for conditions managed today: No orders of the defined types were placed in this encounter.   Disposition: No follow-ups on file.  Dragon Medical One speech-to-text software was used for transcription in this dictation.  Possible transcriptional errors can occur using Animal nutritionist.   Signed,  Mirza DASEN. Meiling Hendriks, MD   Outpatient Encounter Medications as of 03/29/2024  Medication Sig   acetaminophen (TYLENOL) 500 MG tablet Take 1,000 mg by mouth every 8 (eight) hours as needed.   ALBUTEROL IN 1-2 puffs every 4 (four) hours as  needed.   Ascorbic Acid (VITAMIN C) 1000 MG tablet Take 1,000 mg by mouth daily. Liposomal   aspirin EC 81 MG tablet Take 81 mg by mouth every other day. Swallow whole.   beclomethasone (QVAR  REDIHALER) 40 MCG/ACT inhaler Inhale 1-2 puffs into the lungs 2 (two) times daily.   carvedilol  (COREG ) 25 MG tablet Take 1 tablet (25 mg total) by mouth 2 (two) times daily with a meal.   celecoxib  (CELEBREX ) 100 MG capsule TAKE 1 CAPSULE BY MOUTH TWICE  DAILY   Cholecalciferol (VITAMIN D3) 50 MCG (2000 UT) TABS Take 1 tablet by mouth daily.   cyclobenzaprine  (FLEXERIL ) 10 MG tablet Take 1 tablet (10 mg total) by mouth every 8 (eight) hours as needed (muscle tension).   DULoxetine  (CYMBALTA ) 30 MG capsule Take 1 capsule (30 mg total) by mouth every evening.   DULoxetine  (CYMBALTA ) 60 MG capsule Take 1 capsule (60 mg total) by mouth every evening. Take with 30 mg for 90 mg daily   fluticasone (FLONASE) 50 MCG/ACT nasal spray Place 2 sprays into both nostrils daily.   hydrOXYzine (ATARAX) 25 MG tablet TAKE 1 TO 2 TABS BY MOUTH AS NEEDED FOR INSOMNIA   levothyroxine  (SYNTHROID ) 100 MCG tablet Take 1 tablet (100 mcg total) by mouth daily before breakfast.   losartan  (COZAAR ) 100 MG tablet Take 1 tablet (100 mg total) by mouth every evening.   Misc Natural Products (TURMERIC CURCUMIN) CAPS Take 1 capsule by mouth daily. 2000 mg daily (Liposomal)   Multiple Vitamins-Minerals (ICAPS AREDS 2 PO) Take 1 capsule by mouth  daily.   omeprazole  (PRILOSEC) 40 MG capsule TAKE 1 CAPSULE BY MOUTH TWICE  DAILY   ondansetron (ZOFRAN) 4 MG tablet Take 4 mg by mouth every 6 (six) hours as needed for nausea or vomiting.   Polyethylene Glycol 3350 (MIRALAX PO) Take by mouth every other day.   Probiotic Product (PROBIOTIC PO) Take 1 capsule by mouth daily.   rOPINIRole  (REQUIP ) 1 MG tablet Take 1.5 tablets (1.5 mg total) by mouth at bedtime.   rosuvastatin  (CRESTOR ) 10 MG tablet Take 1 tablet (10 mg total) by mouth every evening.    semaglutide -weight management (WEGOVY ) 1.7 MG/0.75ML SOAJ SQ injection Inject 1.7 mg into the skin once a week.   sennosides-docusate sodium (SENOKOT-S) 8.6-50 MG tablet Take 1-2 tablets by mouth daily as needed for constipation.   sucralfate (CARAFATE) 1 g tablet Take 1 g by mouth 2 (two) times daily as needed (Stomach Pain).   SUMAtriptan  (IMITREX ) 50 MG tablet Take 1 tablet (50 mg total) by mouth every 2 (two) hours as needed for migraine. May repeat in 2 hours if headache persists or recurs.   verapamil  (VERELAN ) 240 MG 24 hr capsule Take 1 capsule (240 mg total) by mouth every morning.   No facility-administered encounter medications on file as of 03/29/2024.

## 2024-03-29 ENCOUNTER — Encounter: Payer: Self-pay | Admitting: Family Medicine

## 2024-03-29 ENCOUNTER — Ambulatory Visit: Admitting: Family Medicine

## 2024-03-29 VITALS — BP 136/76 | HR 75 | Temp 97.8°F | Ht 60.0 in | Wt 162.6 lb

## 2024-03-29 DIAGNOSIS — M19011 Primary osteoarthritis, right shoulder: Secondary | ICD-10-CM

## 2024-03-30 ENCOUNTER — Other Ambulatory Visit: Payer: Self-pay | Admitting: Family Medicine

## 2024-04-03 MED ORDER — TRIAMCINOLONE ACETONIDE 40 MG/ML IJ SUSP
40.0000 mg | Freq: Once | INTRAMUSCULAR | Status: AC
  Administered 2024-03-29: 40 mg via INTRA_ARTICULAR

## 2024-04-03 NOTE — Addendum Note (Signed)
 Addended by: SEBASTIAN DANNA GRADE on: 04/03/2024 07:54 AM   Modules accepted: Orders

## 2024-04-04 ENCOUNTER — Ambulatory Visit: Payer: Self-pay

## 2024-04-04 NOTE — Telephone Encounter (Signed)
 I just spoke to the patient, and fortunately her husband died suddenly unexpectedly last evening.  I will of course do the best certificate.  She can take an additional hydroxyzine at nighttime if needed.  We also talked about the Requip , and I would first do the hydroxyzine because she needs to take a little bit extra of the Requip , that would also be okay.

## 2024-04-04 NOTE — Telephone Encounter (Signed)
 FYI Only or Action Required?: Action required by provider: Requesting to speak to PCP.  Patient was last seen in primary care on 03/29/2024 by Watt Mirza, MD.  Called Nurse Triage reporting Advice Only.  Symptoms began n/a.  Interventions attempted: Other: n/a.  Symptoms are: n/a.  Triage Disposition: Call PCP When Office is Open  Patient/caregiver understands and will follow disposition?: Yes Reason for Disposition  [1] Caller requesting NON-URGENT health information AND [2] PCP's office is the best resource  Answer Assessment - Initial Assessment Questions rOPINIRole  (REQUIP ) 1 MG tablet 1.5 at bedtime, patient states this helps restless legs at night but last night it did not and wants to know if she can take more. hydrOXYzine (ATARAX) 25 MG tablet at bedtime for insomnia, states this did not help last night either. Patient has 1pm appointment at [facility]. Patient is wondering if PCP will be signing death certification. Patient requesting a call back, please advise.   1. REASON FOR CALL: What is the main reason for your call? or How can I best help you?     Patient's husband passed away last night and has questions.   2. SYMPTOMS : Do you have any symptoms?      Legs restless last night, insomnia  Protocols used: Information Only Call - No Triage-A-AH

## 2024-04-11 ENCOUNTER — Ambulatory Visit: Admitting: Clinical

## 2024-04-17 ENCOUNTER — Encounter: Payer: Self-pay | Admitting: Family Medicine

## 2024-04-17 MED ORDER — NURTEC 75 MG PO TBDP: 75.0000 mg | ORAL_TABLET | ORAL | 1 refills | Status: DC

## 2024-04-17 MED ORDER — ALPRAZOLAM 0.5 MG PO TABS: 0.5000 mg | ORAL_TABLET | Freq: Two times a day (BID) | ORAL | 0 refills | Status: AC | PRN

## 2024-04-24 ENCOUNTER — Telehealth: Payer: Self-pay

## 2024-04-24 ENCOUNTER — Other Ambulatory Visit (HOSPITAL_COMMUNITY): Payer: Self-pay

## 2024-04-24 NOTE — Telephone Encounter (Signed)
 Pharmacy Patient Advocate Encounter  Received notification from [facility] that Prior Authorization for Nurtec 75 has been APPROVED from 04/24/24 to 05/24/25 for 15 tablets per 30 days. Ran test claim, Copay is $0.00. This test claim was processed through [facility]- copay amounts may vary at other pharmacies due to pharmacy/plan contracts, or as the patient moves through the different stages of their insurance plan.   PA #/Case ID/Reference #: # [id number]

## 2024-04-24 NOTE — Telephone Encounter (Signed)
 Pharmacy Patient Advocate Encounter   Received notification from Onbase that prior authorization for Nurtec 75 is required/requested.   Insurance verification completed.   The patient is insured through [facility].   Per test claim: PA required; PA submitted to above mentioned insurance via Latent Key/confirmation #/EOC [id number] Status is pending

## 2024-05-03 ENCOUNTER — Other Ambulatory Visit: Payer: Self-pay | Admitting: Family Medicine

## 2024-05-05 ENCOUNTER — Telehealth: Payer: Self-pay | Admitting: *Deleted

## 2024-05-05 NOTE — Telephone Encounter (Signed)
 Copied from CRM #[id number]. Topic: Clinical - Medication Question >> May 05, 2024 12:02 PM Winona SAUNDERS wrote: Abby calling from [employer] requesting provider review medications below As they may have side effects for pts withing this age group. cyclobenzaprine  (FLEXERIL ) 10 MG tablet, hydrOXYzine (ATARAX) 25 MG tablet Call back  [phone]

## 2024-05-05 NOTE — Telephone Encounter (Signed)
 Appropriate usage.

## 2024-05-14 ENCOUNTER — Other Ambulatory Visit: Payer: Self-pay | Admitting: Family Medicine

## 2024-05-17 ENCOUNTER — Other Ambulatory Visit: Payer: Self-pay | Admitting: Family Medicine

## 2024-05-23 ENCOUNTER — Ambulatory Visit

## 2024-06-05 ENCOUNTER — Other Ambulatory Visit: Payer: Self-pay | Admitting: Family Medicine

## 2024-06-05 MED ORDER — HYDROXYZINE HCL 25 MG PO TABS: 25.0000 mg | ORAL_TABLET | Freq: Every evening | ORAL | 0 refills | Status: DC | PRN

## 2024-06-05 MED ORDER — NURTEC 75 MG PO TBDP: 75.0000 mg | ORAL_TABLET | ORAL | 0 refills | Status: DC

## 2024-06-05 MED ORDER — CYCLOBENZAPRINE HCL 10 MG PO TABS: 10.0000 mg | ORAL_TABLET | Freq: Three times a day (TID) | ORAL | 1 refills | Status: AC | PRN

## 2024-06-05 MED ORDER — SUMATRIPTAN SUCCINATE 50 MG PO TABS: ORAL_TABLET | ORAL | 0 refills | Status: AC

## 2024-06-05 NOTE — Telephone Encounter (Signed)
 I called and couldn't leave a voicemail since it is full.

## 2024-06-05 NOTE — Telephone Encounter (Signed)
 Last office visit 03/29/2024 for shoulder pain.  Last refilled 01/26/2024 for #90 with 1 refill.  Next appt: CPE 07/26/2024.  Please see if patient will schedule Medicare Wellness with Erminio prior to her CPE with Dr. Watt on 07/26/2024.

## 2024-06-05 NOTE — Telephone Encounter (Signed)
 Pt would like her cyclobenzaprine , hydroxyzine , sumatripton and nurtec medications to be refilled.

## 2024-06-05 NOTE — Telephone Encounter (Signed)
 Her pharmacy has been changed to [facility] [phone]

## 2024-06-07 ENCOUNTER — Encounter: Payer: Self-pay | Admitting: Family Medicine

## 2024-06-07 MED ORDER — PREDNISONE 20 MG PO TABS: ORAL_TABLET | ORAL | 0 refills | Status: AC

## 2024-06-08 ENCOUNTER — Ambulatory Visit (INDEPENDENT_AMBULATORY_CARE_PROVIDER_SITE_OTHER): Admitting: Clinical

## 2024-06-08 DIAGNOSIS — F4321 Adjustment disorder with depressed mood: Secondary | ICD-10-CM

## 2024-06-08 NOTE — Progress Notes (Signed)
 [facility] Counselor Initial Adult Exam  Name: Joyce York Date: 06/08/2024 MRN: [id number] DOB: [birth year] PCP: Watt Mirza, MD  Time spent: 8:35am - 9:18am  Guardian/Payee:  NA    Paperwork requested: NA  Reason for Visit /Presenting Problem: Patient stated, this started about four months ago and I asked Dr. Watt about coming in, at that point my concerns were being a caregiver and since then my husband has died, my trajectory has changed whether I wanted it or not. Patient reported husband died 04-27-25. Patient stated, I'm trying to adjust to being alone.   Mental Status Exam: Appearance:   Neat and Well Groomed     Behavior:  Appropriate  Motor:  Normal  Speech/Language:   Clear and Coherent and Normal Rate  Affect:  Tearful  Mood:  Patient stated, I don't feel good today  Thought process:  normal  Thought content:    WNL  Sensory/Perceptual disturbances:    WNL and Patient reported patient currently has a cold  Orientation:  oriented to person, place, situation, day of week, month of year, and year  Attention:  Good  Concentration:  Good  Memory:  WNL  Fund of knowledge:   Good  Insight:    Good  Judgment:   Good  Impulse Control:  Good   Reported Symptoms: Patient stated, I have my days where I'm teary and sad, I don't have much of one in reference to appetite, difficulty staying asleep, I'm exhausted, decreased focus, anger. Patient reported a history of depression in response to work related stressors (sadness, crying).   Risk Assessment: Danger to Self:  No Patient denied current and past suicidal ideation and homicidal ideation. Patient denied current symptoms of psychosis. Patient reported a history of hallucinations and psychiatric hospitalization when Cymbalta  was discontinued.  Self-injurious Behavior: No Danger to Others: No Duty to Warn:no Physical Aggression / Violence:No  Access to Firearms a concern: No   Gang Involvement:No  Patient / guardian was educated about steps to take if suicide or homicide risk level increases between visits: yes While future psychiatric events cannot be accurately predicted, the patient does not currently require acute inpatient psychiatric care and does not currently meet [place]  involuntary commitment criteria.  Substance Abuse History: Current substance abuse: No   Patient reported no current substance use. Patient reported a history of daily marijuana use, patient stated most of the day, with last use on December 24, 2022. Patient reported no history of tobacco use. Patient reported a history of occasional alcohol use.   Past Psychiatric History:   Previous psychological history is significant for depression Outpatient Providers: Patient reported a history of individual therapy through [facility] (3 sessions) for work related stress and individual therapy in [place]  History of Psych Hospitalization: Yes  once in [place]  Psychological Testing: none   Abuse History:  Victim of: No., none   Report needed: No. Victim of Neglect:No. Perpetrator of none reported  Witness / Exposure to Domestic Violence: No   Protective Services Involvement: No  Witness to [employer] Violence:  No   Family History:  Family History  Problem Relation Age of Onset   Stroke Mother    Hyperlipidemia Mother    Heart disease Mother    Depression Mother    Arthritis Mother    Hypertension Mother    Heart disease Father    Hyperlipidemia Father    Hearing loss Father    Arthritis Father    Hyperlipidemia Sister  Hearing loss Sister    Asthma Sister    Arthritis Sister    Breast cancer Daughter 83   Miscarriages / Stillbirths Daughter    Cancer Daughter    Stroke Maternal Grandmother    Hyperlipidemia Maternal Grandmother    Arthritis Maternal Grandmother    Heart disease Maternal Grandfather    Drug abuse Maternal Grandfather    Heart attack Maternal Grandfather     Cancer Paternal Grandmother    Asthma Paternal Grandmother    Arthritis Paternal Grandmother    Birth defects Paternal Grandfather    Hyperlipidemia Brother    Arthritis Brother     Living situation: the patient lives alone  Sexual Orientation: Straight  Relationship Status: widowed  Name of spouse / other: Mal If a parent, number of children / ages: 1 daughter age 56  Support Systems: daughter and son in Teacher, English As A Foreign Language Stress:  No   Income/Employment/Disability: retired  Financial Planner: No   Educational History: Education: associates degree in nursing  Religion/Sprituality/World View: presbyterian  Any cultural differences that may affect / interfere with treatment:  not applicable   Recreation/Hobbies: sewing, reading  Stressors: Loss of husband, friend's health    Strengths: Supportive Relationships, Family, Church, and Spirituality  Barriers:  none    Legal History: Pending legal issue / charges: The patient has no significant history of legal issues. History of legal issue / charges: none  Medical History/Surgical History: reviewed Past Medical History:  Diagnosis Date   Allergy    Arthritis    Asthma    Barrett's esophagus without dysplasia 07/26/2023   Depression    GAD (generalized anxiety disorder) 07/26/2023   GERD (gastroesophageal reflux disease)    Glaucoma    Hyperlipidemia    Hypertension    Hypothyroidism    Macular degeneration of both eyes 07/25/2023   Migraine    OSA treated with BiPAP 07/26/2023   Restless leg syndrome 07/26/2023   Sleep apnea 2018   TIA (transient ischemic attack)     Past Surgical History:  Procedure Laterality Date   ABDOMINAL HYSTERECTOMY  1995   Fibroids   APPENDECTOMY  1995   BREAST BIOPSY     Biopsies x2   CATARACT EXTRACTION W/ INTRAOCULAR LENS IMPLANT  2024-2025   Cataract with MIG proceure   CHOLECYSTECTOMY  2005   OOPHORECTOMY  1995   REVERSE SHOULDER ARTHROPLASTY Left 10/2021     Medications: Current Outpatient Medications  Medication Sig Dispense Refill   acetaminophen (TYLENOL) 500 MG tablet Take 1,000 mg by mouth every 8 (eight) hours as needed.     ALBUTEROL IN 1-2 puffs every 4 (four) hours as needed.     ALPRAZolam  (XANAX ) 0.5 MG tablet Take 1 tablet (0.5 mg total) by mouth 2 (two) times daily as needed for anxiety or sleep. 20 tablet 0   Ascorbic Acid (VITAMIN C) 1000 MG tablet Take 1,000 mg by mouth daily. Liposomal     aspirin EC 81 MG tablet Take 81 mg by mouth every other day. Swallow whole.     azithromycin (ZITHROMAX) 250 MG tablet Take 250 mg by mouth as directed.     beclomethasone (QVAR  REDIHALER) 40 MCG/ACT inhaler INHALE 1 TO 2 INHALATIONS BY  MOUTH INTO THE LUNGS TWICE DAILY 31.8 g 0   carvedilol  (COREG ) 25 MG tablet Take 1 tablet (25 mg total) by mouth 2 (two) times daily with a meal. 180 tablet 3   celecoxib  (CELEBREX ) 100 MG capsule TAKE 1 CAPSULE BY MOUTH  TWICE  DAILY 180 capsule 3   Cholecalciferol (VITAMIN D3) 50 MCG (2000 UT) TABS Take 1 tablet by mouth daily.     cyclobenzaprine  (FLEXERIL ) 10 MG tablet Take 1 tablet (10 mg total) by mouth every 8 (eight) hours as needed (muscle tension). 90 tablet 1   DULoxetine  (CYMBALTA ) 30 MG capsule TAKE 1 CAPSULE BY MOUTH IN THE  EVENING 90 capsule 3   DULoxetine  (CYMBALTA ) 60 MG capsule TAKE 1 CAPSULE BY MOUTH EVERY  EVENING WITH 30 MG CAPSULE FOR A TOTAL DAILY DOSE OF 90 MG 90 capsule 3   fluticasone (FLONASE) 50 MCG/ACT nasal spray Place 2 sprays into both nostrils daily.     hydrOXYzine  (ATARAX ) 25 MG tablet Take 1-2 tablets (25-50 mg total) by mouth at bedtime as needed. 180 tablet 0   levothyroxine  (SYNTHROID ) 100 MCG tablet Take 1 tablet (100 mcg total) by mouth daily before breakfast. 90 tablet 3   losartan  (COZAAR ) 100 MG tablet Take 1 tablet (100 mg total) by mouth every evening. 100 tablet 2   Misc Natural Products (TURMERIC CURCUMIN) CAPS Take 1 capsule by mouth daily. 2000 mg daily  (Liposomal)     Multiple Vitamins-Minerals (ICAPS AREDS 2 PO) Take 1 capsule by mouth daily.     omeprazole  (PRILOSEC) 40 MG capsule TAKE 1 CAPSULE BY MOUTH TWICE  DAILY 200 capsule 0   ondansetron (ZOFRAN) 4 MG tablet Take 4 mg by mouth every 6 (six) hours as needed for nausea or vomiting.     Polyethylene Glycol 3350 (MIRALAX PO) Take by mouth every other day.     predniSONE  (DELTASONE ) 20 MG tablet 2 tabs po for 4 days, then 1 tab po for 4 days 12 tablet 0   Probiotic Product (PROBIOTIC PO) Take 1 capsule by mouth daily.     Rimegepant Sulfate (NURTEC) 75 MG TBDP Take 1 tablet (75 mg total) by mouth every other day. 45 tablet 0   rOPINIRole  (REQUIP ) 1 MG tablet TAKE 1 AND 1/2 TABLETS BY MOUTH  AT BEDTIME 150 tablet 3   rosuvastatin  (CRESTOR ) 10 MG tablet TAKE 1 TABLET BY MOUTH EVERY  EVENING 100 tablet 3   semaglutide -weight management (WEGOVY ) 1.7 MG/0.75ML SOAJ SQ injection Inject 1.7 mg into the skin once a week. 3 mL 5   sennosides-docusate sodium (SENOKOT-S) 8.6-50 MG tablet Take 1-2 tablets by mouth daily as needed for constipation.     sucralfate (CARAFATE) 1 g tablet Take 1 g by mouth 2 (two) times daily as needed (Stomach Pain).     SUMAtriptan  (IMITREX ) 50 MG tablet TAKE 1 TABLET BY MOUTH EVERY 2  HOURS AS NEEDED FOR MIGRAINE.  MAY REPEAT IN 2 HOURS IF  HEADACHE PERSISTS OR RECUR 10 tablet 0   verapamil  (VERELAN ) 240 MG 24 hr capsule Take 1 capsule (240 mg total) by mouth every morning. 90 capsule 3   No current facility-administered medications for this visit.    Allergies[1]  Diagnoses:  Adjustment disorder with depressed mood  Plan of Care: Patient is a 74 year old female who presented for an initial assessment. Clinician conducted initial assessment in person from clinician's office at [facility]. Patient reported the following symptoms: I have my days where I'm teary and sad, I don't have much of one in reference to appetite, difficulty staying asleep, I'm  exhausted, decreased focus, anger. Patient denied current and past suicidal ideation and homicidal ideation. Patient denied current symptoms of psychosis. Patient reported a history of hallucinations. Patient reported no  current substance use. Patient reported a history of daily marijuana use with last use on December 24, 2022. Patient reported a history of participation in individual therapy. Patient reported one previous psychiatric hospitalization. Patient reported the loss of patient's husband and friend's health are current stressors. Patient reported patient's daughter and son in law are current supports. It is recommended patient participate in individual therapy biweekly. Clinician will review recommendations and treatment plan with patient during follow up appointment. Treatment plan will be developed during follow up appointment.   Collaboration of Care: Primary Care Provider AEB Patient requested to complete a consent for PCP, Dr. Jacques Copland  Patient/Guardian was advised Release of Information must be obtained prior to any record release in order to collaborate their care with an outside provider. Patient/Guardian was advised if they have not already done so to contact [facility] to sign all necessary forms in order for us  to release information regarding their care.   Consent: Patient/Guardian gives written consent for treatment and assignment of benefits for services provided during this visit. Patient/Guardian expressed understanding and agreed to proceed.   Darice Seats, LCSW        [1] No Known Allergies  "

## 2024-06-08 NOTE — Progress Notes (Signed)
   Darice Seats, LCSW

## 2024-06-22 ENCOUNTER — Other Ambulatory Visit (HOSPITAL_COMMUNITY): Payer: Self-pay

## 2024-06-22 ENCOUNTER — Telehealth: Payer: Self-pay

## 2024-06-22 NOTE — Telephone Encounter (Signed)
 Pharmacy Patient Advocate Encounter   Received notification from [facility] KEY that prior authorization for Hydroxyzine  Hcl 25 is required/requested.   Insurance verification completed.   The patient is insured through [facility].   Per test claim: PA required; PA submitted to above mentioned insurance via Latent Key/confirmation #/EOC [id number] Status is pending

## 2024-06-22 NOTE — Telephone Encounter (Signed)
 Pharmacy Patient Advocate Encounter   Received notification from [facility] KEY that prior authorization for Nurtec 75 is required/requested.   Insurance verification completed.   The patient is insured through [facility].   Per test claim: PA required; PA submitted to above mentioned insurance via Latent Key/confirmation #/EOC [id number] Status is pending

## 2024-06-23 ENCOUNTER — Other Ambulatory Visit (HOSPITAL_COMMUNITY): Payer: Self-pay

## 2024-06-23 NOTE — Telephone Encounter (Signed)
 Pharmacy Patient Advocate Encounter  Received notification from [facility] that Prior Authorization for Nurtec 75 has been APPROVED from 06/22/24 to 06/22/25   PA #/Case ID/Reference #: # [id number]

## 2024-06-27 ENCOUNTER — Other Ambulatory Visit (HOSPITAL_COMMUNITY): Payer: Self-pay

## 2024-06-29 ENCOUNTER — Encounter: Payer: Self-pay | Admitting: Family Medicine

## 2024-06-29 ENCOUNTER — Other Ambulatory Visit: Payer: Self-pay | Admitting: Family Medicine

## 2024-06-29 MED ORDER — NURTEC 75 MG PO TBDP: 75.0000 mg | ORAL_TABLET | ORAL | 0 refills | Status: AC

## 2024-06-30 ENCOUNTER — Ambulatory Visit: Admitting: Clinical

## 2024-06-30 ENCOUNTER — Encounter: Payer: Self-pay | Admitting: Clinical

## 2024-06-30 DIAGNOSIS — F4321 Adjustment disorder with depressed mood: Secondary | ICD-10-CM

## 2024-06-30 MED ORDER — HYDROXYZINE HCL 25 MG PO TABS: 25.0000 mg | ORAL_TABLET | Freq: Every evening | ORAL | 0 refills | Status: AC | PRN

## 2024-06-30 NOTE — Progress Notes (Signed)
 "  [facility] Counselor/Therapist Progress Note  Patient ID: Joyce York, MRN: [id number],    Date: 06/30/2024  Time Spent: 2:35pm - 3:28pm : 53 minutes   Treatment Type: Individual Therapy  Reported Symptoms: sadness, tearful  Mental Status Exam: Appearance:  Neat and Well Groomed     Behavior: Appropriate  Motor: Normal  Speech/Language:  Clear and Coherent and Normal Rate  Affect: Tearful  Mood: sad  Thought process: normal  Thought content:   WNL  Sensory/Perceptual disturbances:   WNL  Orientation: oriented to person, place, time/date, and situation  Attention: Good  Concentration: Good  Memory: WNL  Fund of knowledge:  Good  Insight:   Good  Judgment:  Good  Impulse Control: Good   Risk Assessment: Danger to Self:  No Patient denied current suicidal ideation  Self-injurious Behavior: No Danger to Others: No Patient denied current homicidal ideation Duty to Warn:no Physical Aggression / Violence:No  Access to Firearms a concern: No  Gang Involvement:No   Subjective: Patient reported no changes in response to events since last session. Patient stated, some days are good, I think I'm on top of the world, some days are not so good. Patient reported sadness and feelings of frustration at times. Patient stated, good, decent in response to current mood. Patient reported patient joined a grief share group and is excited to participate in group. Patient reported the group starts on the 24th of February. Patient stated,  I think that its good to work on this stuff in response to recommendation for therapy.  Patient reported missing patient's husband. Patient stated, to not feel like I'm so up and down, to level things off a bit in response to goals for therapy.   Interventions: Motivational Interviewing. Clinician conducted session via caregility video from clinician's home office. Patient provided verbal consent to proceed with telehealth session and  is aware of limitations of telephone or video visits. Patient participated in session from patient's home. Clinician reviewed diagnosis and treatment recommendations; and provided psycho education related to diagnosis and treatment as part of treatment planning process. Clinician utilized motivational interviewing to explore potential goals for therapy. Clinician utilized a task centered approach in collaboration with patient to develop treatment plan. Patient participated in development of treatment plan and verbally agreed to treatment plan.  Diagnosis:Adjustment disorder with depressed mood  Collaboration of Care: Other not required at this time  Plan: Behavioral Health Treatment Plan   Name:Joyce York  [facility] Medicare  MRN: [id number]   Treatment Plan Development Date: 06/30/24   Strengths: Supportive Relationships, Family, Church, and Spirituality  Supports: daughter and son in Retail Buyer of Needs: At the time of the assessment patient stated, this started about four months ago and I asked Dr. Watt about coming in, at that point my concerns were being a caregiver and since then my husband has died, my trajectory has changed whether I wanted it or not. Patient reported husband died 04/06/25. Patient stated, I'm trying to adjust to being alone.    Treatment Level: outpatient therapy  Client Treatment Preferences: in person sessions   Diagnosis: Adjustment Disorder  Adjustment Disorder with depressed mood  Symptoms:  The development of emotional or behavioral symptoms in response to an identifiable stressor(s) occurring within 3 months of the onset of the stressor(s).  Goals:  Improve coping skills by learning effective ways to adapt and manage depressive symptoms, grief, and stressors and Return to previous  functioning and reduce or eliminate symptoms.  Objectives: Target Date For All Objectives: 06/30/25  Process  feelings of grief and begin a healthy grieving process, Enhance problem-solving abilities and develop skills to address and resolve challenges., Interrupt negative thinking and increase positive thoughts and feelings, and Build resilience and learn to adapt to stress and hardships.  Progress Documentation:   Established 06/30/24  Interventions:  Cognitive Behavioral Therapy, Assertiveness/Communication, Grief Therapy, and problem solving   Expected duration of treatment: 12 months  Party responsible for implementation of interventions: Darice Seats, MSW, LCSW, therapist and Charish Schroepfer, patient   This plan has been reviewed and created by the following participants: Darice Seats, MSW, LCSW, therapist and Lyndy Russman, patient   A new plan will be created at least every 12 months.  The patient fully participated in the development of treatment plan with the clinician and verbally consents to such treatment.   Patient Treatment Plan Signature Obtained: No, pending signature via MyChart.    Darice Seats, LCSW    "

## 2024-06-30 NOTE — Progress Notes (Signed)
   Darice Seats, LCSW

## 2024-06-30 NOTE — Telephone Encounter (Signed)
 Please submit PA for hydroxyzine .

## 2024-06-30 NOTE — Progress Notes (Signed)
 Behavioral Health Treatment Plan   Name:Joyce York  [facility] [place]  MRN: [id number]   Treatment Plan Development Date: 06/30/24   Strengths: Supportive Relationships, Family, Church, and Spirituality  Supports: daughter and son in Retail Buyer of Needs: At the time of the assessment patient stated, this started about four months ago and I asked Dr. Watt about coming in, at that point my concerns were being a caregiver and since then my husband has died, my trajectory has changed whether I wanted it or not. Patient reported husband died 2025/04/24. Patient stated, I'm trying to adjust to being alone.    Treatment Level: outpatient therapy  Client Treatment Preferences: in person sessions   Diagnosis: Adjustment Disorder  Adjustment Disorder with depressed mood  Symptoms:  The development of emotional or behavioral symptoms in response to an identifiable stressor(s) occurring within 3 months of the onset of the stressor(s).  Goals:  Improve coping skills by learning effective ways to adapt and manage depressive symptoms, grief, and stressors and Return to previous functioning and reduce or eliminate symptoms.  Objectives: Target Date For All Objectives: 06/30/25  Process feelings of grief and begin a healthy grieving process, Enhance problem-solving abilities and develop skills to address and resolve challenges., Interrupt negative thinking and increase positive thoughts and feelings, and Build resilience and learn to adapt to stress and hardships.  Progress Documentation:   Established 06/30/24  Interventions:  Cognitive Behavioral Therapy, Assertiveness/Communication, Grief Therapy, and problem solving   Expected duration of treatment: 12 months  Party responsible for implementation of interventions: Darice Seats, MSW, LCSW, therapist and Aidee Latimore, patient   This plan has been reviewed and created by the following  participants: Darice Seats, MSW, LCSW, therapist and Aneesha Holloran, patient   A new plan will be created at least every 12 months.  The patient fully participated in the development of treatment plan with the clinician and verbally consents to such treatment.   Patient Treatment Plan Signature Obtained: No, pending signature via MyChart.                 Darice Seats, LCSW

## 2024-07-19 ENCOUNTER — Other Ambulatory Visit

## 2024-07-19 ENCOUNTER — Ambulatory Visit: Admitting: Clinical

## 2024-07-26 ENCOUNTER — Encounter: Admitting: Family Medicine

## 2024-10-26 ENCOUNTER — Ambulatory Visit: Payer: Self-pay
